# Patient Record
Sex: Female | Born: 1935 | Race: White | Hispanic: No | Marital: Married | Smoking: Never smoker
Health system: Southern US, Community
[De-identification: ages and names within clinical notes are randomized; demographics above are authoritative.]

## PROBLEM LIST (undated history)

## (undated) ENCOUNTER — Emergency Department (HOSPITAL_COMMUNITY): Discharge status: Against Medical Advice | Payer: Medicare Other

## (undated) ENCOUNTER — Emergency Department (HOSPITAL_BASED_OUTPATIENT_CLINIC_OR_DEPARTMENT_OTHER): Payer: Medicare Other

## (undated) DIAGNOSIS — K219 Gastro-esophageal reflux disease without esophagitis: Secondary | ICD-10-CM

## (undated) DIAGNOSIS — R002 Palpitations: Secondary | ICD-10-CM

## (undated) DIAGNOSIS — K635 Polyp of colon: Secondary | ICD-10-CM

## (undated) DIAGNOSIS — E559 Vitamin D deficiency, unspecified: Secondary | ICD-10-CM

## (undated) DIAGNOSIS — E669 Obesity, unspecified: Secondary | ICD-10-CM

## (undated) DIAGNOSIS — R0600 Dyspnea, unspecified: Secondary | ICD-10-CM

## (undated) DIAGNOSIS — E7800 Pure hypercholesterolemia, unspecified: Secondary | ICD-10-CM

## (undated) DIAGNOSIS — I10 Essential (primary) hypertension: Secondary | ICD-10-CM

## (undated) DIAGNOSIS — K5790 Diverticulosis of intestine, part unspecified, without perforation or abscess without bleeding: Secondary | ICD-10-CM

## (undated) HISTORY — DX: Palpitations: R00.2

## (undated) HISTORY — DX: Polyp of colon: K63.5

## (undated) HISTORY — DX: Vitamin D deficiency, unspecified: E55.9

## (undated) HISTORY — DX: Pure hypercholesterolemia, unspecified: E78.00

## (undated) HISTORY — DX: Gastro-esophageal reflux disease without esophagitis: K21.9

## (undated) HISTORY — DX: Diverticulosis of intestine, part unspecified, without perforation or abscess without bleeding: K57.90

## (undated) HISTORY — DX: Dyspnea, unspecified: R06.00

## (undated) HISTORY — DX: Essential (primary) hypertension: I10

## (undated) HISTORY — DX: Obesity, unspecified: E66.9

## (undated) HISTORY — PX: COLONOSCOPY: SHX174

---

## 1999-02-05 ENCOUNTER — Ambulatory Visit (HOSPITAL_COMMUNITY): Admission: RE | Admit: 1999-02-05 | Discharge: 1999-02-05 | Payer: Self-pay | Admitting: Gastroenterology

## 1999-06-21 ENCOUNTER — Encounter: Payer: Self-pay | Admitting: Family Medicine

## 1999-06-21 ENCOUNTER — Encounter: Admission: RE | Admit: 1999-06-21 | Discharge: 1999-06-21 | Payer: Self-pay | Admitting: Family Medicine

## 1999-09-30 ENCOUNTER — Encounter (INDEPENDENT_AMBULATORY_CARE_PROVIDER_SITE_OTHER): Payer: Self-pay

## 1999-09-30 ENCOUNTER — Other Ambulatory Visit: Admission: RE | Admit: 1999-09-30 | Discharge: 1999-09-30 | Payer: Self-pay | Admitting: Obstetrics and Gynecology

## 2000-06-26 ENCOUNTER — Encounter: Admission: RE | Admit: 2000-06-26 | Discharge: 2000-06-26 | Payer: Self-pay | Admitting: Family Medicine

## 2000-06-26 ENCOUNTER — Encounter: Payer: Self-pay | Admitting: Family Medicine

## 2001-06-28 ENCOUNTER — Encounter: Payer: Self-pay | Admitting: Family Medicine

## 2001-06-28 ENCOUNTER — Encounter: Admission: RE | Admit: 2001-06-28 | Discharge: 2001-06-28 | Payer: Self-pay | Admitting: Family Medicine

## 2002-07-01 ENCOUNTER — Encounter: Payer: Self-pay | Admitting: Family Medicine

## 2002-07-01 ENCOUNTER — Encounter: Admission: RE | Admit: 2002-07-01 | Discharge: 2002-07-01 | Payer: Self-pay | Admitting: Family Medicine

## 2003-05-07 ENCOUNTER — Ambulatory Visit (HOSPITAL_COMMUNITY): Admission: RE | Admit: 2003-05-07 | Discharge: 2003-05-07 | Payer: Self-pay | Admitting: Gastroenterology

## 2003-07-28 ENCOUNTER — Encounter: Admission: RE | Admit: 2003-07-28 | Discharge: 2003-07-28 | Payer: Self-pay | Admitting: Family Medicine

## 2004-08-05 ENCOUNTER — Encounter: Admission: RE | Admit: 2004-08-05 | Discharge: 2004-08-05 | Payer: Self-pay | Admitting: Family Medicine

## 2005-08-16 ENCOUNTER — Encounter: Admission: RE | Admit: 2005-08-16 | Discharge: 2005-08-16 | Payer: Self-pay | Admitting: Obstetrics and Gynecology

## 2006-02-15 ENCOUNTER — Ambulatory Visit: Payer: Self-pay | Admitting: Family Medicine

## 2006-03-18 HISTORY — PX: US ECHOCARDIOGRAPHY: HXRAD669

## 2006-03-22 ENCOUNTER — Ambulatory Visit: Payer: Self-pay | Admitting: Family Medicine

## 2006-03-29 ENCOUNTER — Ambulatory Visit (HOSPITAL_COMMUNITY): Admission: RE | Admit: 2006-03-29 | Discharge: 2006-03-29 | Payer: Self-pay | Admitting: Family Medicine

## 2006-03-29 ENCOUNTER — Encounter: Payer: Self-pay | Admitting: Vascular Surgery

## 2006-03-30 HISTORY — PX: CARDIOVASCULAR STRESS TEST: SHX262

## 2006-08-22 ENCOUNTER — Encounter: Admission: RE | Admit: 2006-08-22 | Discharge: 2006-08-22 | Payer: Self-pay | Admitting: Family Medicine

## 2007-05-23 ENCOUNTER — Ambulatory Visit: Payer: Self-pay | Admitting: Family Medicine

## 2007-09-11 ENCOUNTER — Encounter: Admission: RE | Admit: 2007-09-11 | Discharge: 2007-09-11 | Payer: Self-pay | Admitting: Family Medicine

## 2008-07-15 ENCOUNTER — Ambulatory Visit: Payer: Self-pay | Admitting: Family Medicine

## 2008-09-15 ENCOUNTER — Encounter: Admission: RE | Admit: 2008-09-15 | Discharge: 2008-09-15 | Payer: Self-pay | Admitting: Family Medicine

## 2008-11-25 ENCOUNTER — Ambulatory Visit: Payer: Self-pay | Admitting: Family Medicine

## 2009-07-14 ENCOUNTER — Ambulatory Visit: Payer: Self-pay | Admitting: Family Medicine

## 2009-07-18 LAB — HM DEXA SCAN: HM Dexa Scan: NORMAL

## 2009-08-12 ENCOUNTER — Ambulatory Visit: Payer: Self-pay | Admitting: Family Medicine

## 2009-09-29 ENCOUNTER — Encounter: Admission: RE | Admit: 2009-09-29 | Discharge: 2009-09-29 | Payer: Self-pay | Admitting: Family Medicine

## 2009-09-29 LAB — HM MAMMOGRAPHY: HM Mammogram: NEGATIVE

## 2010-05-18 ENCOUNTER — Ambulatory Visit: Payer: Self-pay | Admitting: Cardiology

## 2010-09-06 ENCOUNTER — Emergency Department (INDEPENDENT_AMBULATORY_CARE_PROVIDER_SITE_OTHER): Payer: MEDICARE

## 2010-09-06 ENCOUNTER — Emergency Department (HOSPITAL_BASED_OUTPATIENT_CLINIC_OR_DEPARTMENT_OTHER)
Admission: EM | Admit: 2010-09-06 | Discharge: 2010-09-06 | Disposition: A | Discharge status: Home / Self Care | Payer: MEDICARE | Attending: Emergency Medicine | Admitting: Emergency Medicine

## 2010-09-06 DIAGNOSIS — G9389 Other specified disorders of brain: Secondary | ICD-10-CM | POA: Insufficient documentation

## 2010-09-06 DIAGNOSIS — S0003XA Contusion of scalp, initial encounter: Secondary | ICD-10-CM

## 2010-09-06 DIAGNOSIS — L03119 Cellulitis of unspecified part of limb: Secondary | ICD-10-CM | POA: Insufficient documentation

## 2010-09-06 DIAGNOSIS — S8000XA Contusion of unspecified knee, initial encounter: Secondary | ICD-10-CM

## 2010-09-06 DIAGNOSIS — W101XXA Fall (on)(from) sidewalk curb, initial encounter: Secondary | ICD-10-CM

## 2010-09-06 DIAGNOSIS — IMO0002 Reserved for concepts with insufficient information to code with codable children: Secondary | ICD-10-CM | POA: Insufficient documentation

## 2010-09-06 DIAGNOSIS — M47812 Spondylosis without myelopathy or radiculopathy, cervical region: Secondary | ICD-10-CM | POA: Insufficient documentation

## 2010-09-06 DIAGNOSIS — L02419 Cutaneous abscess of limb, unspecified: Secondary | ICD-10-CM | POA: Insufficient documentation

## 2010-09-06 DIAGNOSIS — M899 Disorder of bone, unspecified: Secondary | ICD-10-CM

## 2010-09-06 DIAGNOSIS — G319 Degenerative disease of nervous system, unspecified: Secondary | ICD-10-CM | POA: Insufficient documentation

## 2010-09-06 DIAGNOSIS — I10 Essential (primary) hypertension: Secondary | ICD-10-CM | POA: Insufficient documentation

## 2010-09-15 ENCOUNTER — Encounter (INDEPENDENT_AMBULATORY_CARE_PROVIDER_SITE_OTHER): Payer: MEDICARE | Admitting: Family Medicine

## 2010-09-15 DIAGNOSIS — M949 Disorder of cartilage, unspecified: Secondary | ICD-10-CM

## 2010-09-15 DIAGNOSIS — I10 Essential (primary) hypertension: Secondary | ICD-10-CM

## 2010-09-15 DIAGNOSIS — Z0000 Encounter for general adult medical examination without abnormal findings: Secondary | ICD-10-CM

## 2010-09-15 DIAGNOSIS — E785 Hyperlipidemia, unspecified: Secondary | ICD-10-CM

## 2010-09-15 DIAGNOSIS — M899 Disorder of bone, unspecified: Secondary | ICD-10-CM

## 2010-09-30 LAB — HM MAMMOGRAPHY: HM Mammogram: NEGATIVE

## 2010-10-07 ENCOUNTER — Other Ambulatory Visit: Payer: Self-pay | Admitting: Family Medicine

## 2010-10-07 DIAGNOSIS — Z1231 Encounter for screening mammogram for malignant neoplasm of breast: Secondary | ICD-10-CM

## 2010-10-19 ENCOUNTER — Ambulatory Visit
Admission: RE | Admit: 2010-10-19 | Discharge: 2010-10-19 | Disposition: A | Discharge status: Home / Self Care | Payer: MEDICARE | Source: Ambulatory Visit | Attending: Family Medicine | Admitting: Family Medicine

## 2010-10-19 DIAGNOSIS — Z1231 Encounter for screening mammogram for malignant neoplasm of breast: Secondary | ICD-10-CM

## 2010-12-03 NOTE — Op Note (Signed)
   NAME:  Anna Acevedo, BOHANNON                      ACCOUNT NO.:  [id number]   MEDICAL RECORD NO.:  [id number]                   PATIENT TYPE:  AMB   LOCATION:  ENDO                                 FACILITY:  [facility]   PHYSICIAN:  John C. Madilyn Fireman, M.D.                 DATE OF BIRTH:  07-26-1934   DATE OF PROCEDURE:  05/07/2003  DATE OF DISCHARGE:                                 OPERATIVE REPORT   PROCEDURE:  Colonoscopy.   INDICATION FOR PROCEDURE:  History of adenomatous colon polyps, due for  surveillance.   DESCRIPTION OF PROCEDURE:  The patient was placed in the left lateral  decubitus position and placed on the pulse monitor with continuous low-flow  oxygen delivered by nasal cannula.  She was sedated with 50 mcg of IV  fentanyl and 6 mg of IV Versed.  The Olympus video colonoscope was inserted  into the rectum and advanced to the cecum, although I was unable to get the  scope maneuvered around the ileocecal valve and thus about half to a fourth  of the cecum was hidden from view.  The prep was excellent.  The visualized  portion of the cecum, ascending, transverse, and descending colon all  appeared normal with no masses, polyps, diverticula, or other mucosal  abnormalities.  Within the sigmoid colon there was seen some scattered  diverticula and no other abnormalities.  The rectum appeared normal.  Retroflexed view of the anus revealed no obvious internal hemorrhoids.  The  scope was then withdrawn and the patient returned to the recovery room in  stable condition.  She tolerated the procedure well.  There were no  immediate complications.   IMPRESSION:  Diverticulosis of the left colon, otherwise normal colonoscopy  with limited view of the cecum.   PLAN:  Repeat colonoscopy in five years.                                               John C. Madilyn Fireman, M.D.    [facility]/MEDQ  D:  05/07/2003  T:  05/07/2003  Job:  [id number]   cc:   Sharlot Gowda, M.D.  [address] [place], [place] [address]  Fax: [phone]

## 2011-02-17 ENCOUNTER — Encounter: Payer: Self-pay | Admitting: Cardiology

## 2011-02-17 ENCOUNTER — Ambulatory Visit (INDEPENDENT_AMBULATORY_CARE_PROVIDER_SITE_OTHER): Payer: Medicare Other | Admitting: Cardiology

## 2011-02-17 DIAGNOSIS — E7800 Pure hypercholesterolemia, unspecified: Secondary | ICD-10-CM

## 2011-02-17 DIAGNOSIS — E785 Hyperlipidemia, unspecified: Secondary | ICD-10-CM | POA: Insufficient documentation

## 2011-02-17 DIAGNOSIS — R002 Palpitations: Secondary | ICD-10-CM

## 2011-02-17 DIAGNOSIS — I119 Hypertensive heart disease without heart failure: Secondary | ICD-10-CM

## 2011-02-17 DIAGNOSIS — M5430 Sciatica, unspecified side: Secondary | ICD-10-CM

## 2011-02-17 DIAGNOSIS — M5431 Sciatica, right side: Secondary | ICD-10-CM

## 2011-02-17 NOTE — Assessment & Plan Note (Signed)
Patient has developed sciatica of her right thigh since last visit.  Fortunately the discomfort is controlled with low dose Tylenol or ibuprofen or Aleve.

## 2011-02-17 NOTE — Assessment & Plan Note (Signed)
The patient has a history of essential hypertension.She is on metoprolol andLisinopril hydrochlorothiazide.  She's not having any headaches or dizziness.  His not having any side effects from the medication such as cough or rash.  She has not been expressing any chest pain or angina.

## 2011-02-17 NOTE — Assessment & Plan Note (Signed)
The patient has a history of hypercholesterolemia.  She is on pravastatin and her lipids are being monitored by Dr. Susann Givens.She is not having any myalgias from the pravastatin

## 2011-02-17 NOTE — Assessment & Plan Note (Signed)
The patient has a past history of palpitations.  These have improved with current therapy of metoprolol 50 mg twice a day.  She has not been aware of any recent palpitations.

## 2011-02-17 NOTE — Progress Notes (Signed)
Duane Lope Date of Birth:  1934-12-20 [facility] [address]   [address] [place], [place]  [address] [phone]           Fax   [phone]  HPI: This pleasant 76 year old woman is seen for a scheduled six-month followup office visit.  She has a past history of essential hypertension hypercholesterolemia and palpitations.  Since last visit she has been doing well.  She has not been experiencing any chest pain or shortness of breath or recent palpitations.  He does not have any history of ischemic heart disease.  She had a normal nuclear stress test in 2007.  She also had an echocardiogram on 03/27/06 showing normal left ventricular systolic function with an ejection fraction of 55-60% and impaired relaxation mild aortic sclerosis and mild mitral regurgitation but no mitral valve prolapse and normal pulmonary artery pressure.  At the time of that study she had frequent PVCs.  These have subsequently resolved with beta blocker therapy.  Current Outpatient Prescriptions  Medication Sig Dispense Refill  . aspirin 81 MG tablet Take 81 mg by mouth daily.        Marland Kitchen CALCIUM PO Take by mouth. 600 plus d      . lisinopril-hydrochlorothiazide (PRINZIDE,ZESTORETIC) 10-12.5 MG per tablet Take 1 tablet by mouth daily. Per Dr. Susann Givens      . metoprolol (LOPRESSOR) 50 MG tablet Take 50 mg by mouth 2 (two) times daily.        . Multiple Vitamin (MULTIVITAMIN PO) Take by mouth.        Marland Kitchen NAPROXEN PO Take by mouth as needed.        . pravastatin (PRAVACHOL) 40 MG tablet Take 40 mg by mouth daily.          Allergies  Allergen Reactions  . Amoxicillin   . Keflex   . Lisinopril     cough  . Zocor (Simvastatin)     Muscle Pain    Patient Active Problem List  Diagnoses  . Benign hypertensive heart disease without heart failure  . Hypercholesterolemia  . Sciatica of right side  . Palpitations    History  Smoking status  . Never Smoker   Smokeless tobacco  .  Not on file    History  Alcohol Use No    No family history on file.  Review of Systems: The patient denies any heat or cold intolerance.  No weight gain or weight loss.  The patient denies headaches or blurry vision.  There is no cough or sputum production.  The patient denies dizziness.  There is no hematuria or hematochezia.  The patient denies any muscle aches or arthritis.  The patient denies any rash.  The patient denies frequent falling or instability.  There is no history of depression or anxiety.  All other systems were reviewed and are negative.   Physical Exam: Filed Vitals:   02/17/11 1356  BP: 136/78  Pulse: 76   The general appearance reveals a well-developed well-nourished woman in no distress.The head and neck exam reveals pupils equal and reactive.  Extraocular movements are full.  There is no scleral icterus.  The mouth and pharynx are normal.  The neck is supple.  The carotids reveal no bruits.  The jugular venous pressure is normal.  The  thyroid is not enlarged.  There is no lymphadenopathy.  The chest is clear to percussion and auscultation.  There are no rales or rhonchi.  Expansion of the  chest is symmetrical.  The precordium is quiet.  The first heart sound is normal.  The second heart sound is physiologically split.  There is no murmur gallop rub or click.  There is no abnormal lift or heave.  The abdomen is soft and nontender.  The bowel sounds are normal.  The liver and spleen are not enlarged.  There are no abdominal masses.  There are no abdominal bruits.  Extremities reveal good pedal pulses.  There is no phlebitis or edema.  There is no cyanosis or clubbing.  Strength is normal and symmetrical in all extremities.  There is no lateralizing weakness.  There are no sensory deficits.  The skin is warm and dry.  There is no rash.     Assessment / Plan:  The patient is to continue same medication.  Try to lose weight.  Continue conservative therapy for the  sciatica.  Continue cholesterol monitoring with Dr.LaLonde's office. Recheck here in 6 months for office visit and EKG.

## 2011-02-24 ENCOUNTER — Encounter: Payer: Self-pay | Admitting: Family Medicine

## 2011-05-05 ENCOUNTER — Other Ambulatory Visit: Payer: Self-pay | Admitting: *Deleted

## 2011-05-05 MED ORDER — METOPROLOL TARTRATE 50 MG PO TABS: 50.0000 mg | ORAL_TABLET | Freq: Two times a day (BID) | ORAL | Status: DC

## 2011-05-16 ENCOUNTER — Other Ambulatory Visit: Payer: Self-pay | Admitting: Family Medicine

## 2011-05-16 NOTE — Telephone Encounter (Signed)
Phoned in order for Pravachol to [facility] with 3 refills

## 2011-06-22 ENCOUNTER — Encounter: Payer: Self-pay | Admitting: Medical

## 2011-06-22 ENCOUNTER — Ambulatory Visit (INDEPENDENT_AMBULATORY_CARE_PROVIDER_SITE_OTHER): Payer: Medicare Other | Admitting: Medical

## 2011-06-22 VITALS — BP 100/60 | HR 62 | Temp 100.4°F | Resp 16 | Wt 164.0 lb

## 2011-06-22 DIAGNOSIS — J40 Bronchitis, not specified as acute or chronic: Secondary | ICD-10-CM

## 2011-06-22 MED ORDER — AZITHROMYCIN 250 MG PO TABS: ORAL_TABLET | ORAL | Status: AC

## 2011-06-22 MED ORDER — HYDROCODONE-HOMATROPINE 5-1.5 MG/5ML PO SYRP: ORAL_SOLUTION | ORAL | Status: DC

## 2011-06-22 NOTE — Patient Instructions (Signed)
Acute Bronchitis Bronchitis is a problem of the air tubes leading to your lungs. Acute means the illness started quickly. In this condition, the lining of those tubes becomes puffy (swollen) and can leak fluid. This makes it harder for air to get in and out of your lungs. You may cough a lot. This is because the air tubes are narrow. Bronchitis is most often caused by a virus. Medicines that kill germs (antibiotics) may be needed with germ (bacteria) infections for people who:  Smoke.   Have lasting (chronic) lung problems.   Are elderly.  HOME CARE  Rest.   Drink enough water and fluids to keep the pee clear or pale yellow.   Only take medicine as told by your doctor.   Medicines may be prescribed that will open up the airways. This will help make breathing easier.   Bronchitis usually gets better on its own in a few days.  Recovery from some problems (symptoms) of bronchitis may be slow. You should start feeling a little better after 2 to 3 days. Coughing may last for 3 to 4 weeks. GET HELP RIGHT AWAY IF:  You or your child has a temperature by mouth above 101, not controlled by medicine.   Chills or chest pain develops.   You or your child develops very bad shortness of breath.   There is bloody saliva mixed with mucus (sputum).   You or your child throws up (vomits) often, loses too much fluid (dehydration), feels faint, or has a very bad headache.   You or your child does not improve after 1 week of treatment.  MAKE SURE YOU:   Understand these instructions.   Will watch this condition.   Will get help right away if you or your child is not doing well or gets worse.  Document Released: 12/21/2007 Document Re-Released: 09/28/2009 [employer] Patient Information 2011 [employer].  

## 2011-06-22 NOTE — Progress Notes (Signed)
Subjective:   HPI  Anna Acevedo is a 76 y.o. female who presents with 3 day hx/o cough, chest congestion, chest hurts from coughing so much, headache, not feeling well, body aches.  Using some Coricidin HBP but this is drying her out.   Denies wheezing, SOB, no nausea, vomiting, diarrhea, no sore throat, ear pressure, no sick contacts.  She did have flu shot this season.  She does note subjective fever.  No other aggravating or relieving factors.    No other c/o.  The following portions of the patient's history were reviewed and updated as appropriate: allergies, current medications, past family history, past medical history, past social history, past surgical history and problem list.  Past Medical History  Diagnosis Date  . Hypertension     Essential  . Hypercholesterolemia   . Palpitations   . Dyspnea   . Colon polyp   . Obesity   . Osteoporosis     OSTEOPENIA  . Diverticulosis   . GERD (gastroesophageal reflux disease)    Review of Systems Constitutional: +fever, -chills, -sweats, -unexpected -weight change,-fatigue ENT: +runny nose, -ear pain, -sore throat Cardiology:  -chest pain, -palpitations, -edema Respiratory: +cough, -shortness of breath, -wheezing Gastroenterology: -abdominal pain, -nausea, -vomiting, -diarrhea, -constipation Hematology: -bleeding or bruising problems Musculoskeletal: -arthralgias, -myalgias, -joint swelling, -back pain Ophthalmology: -vision changes Urology: -dysuria, -difficulty urinating, -hematuria, -urinary frequency, -urgency Neurology: -headache, -weakness, -tingling, -numbness  Objective:   Filed Vitals:   06/22/11 1119  BP: 100/60  Pulse: 62  Temp: 100.4 F (38 C)  Resp: 16    General appearance: Alert, WD/WN, no distress, ill appearing                             Skin: warm, no rash, no diaphoresis                           Head: no sinus tenderness                            Eyes: conjunctiva normal, corneas clear,  PERRLA                            Ears: pearly TMs, external ear canals normal                          Nose: septum midline, turbinates swollen, with erythema and clear discharge             Mouth/throat: MMM, tongue normal, mild pharyngeal erythema                           Neck: supple, no adenopathy, no thyromegaly, nontender                          Heart: RRR, normal S1, S2, no murmurs                         Lungs: +bronchial breath sounds, no rhonchi, no wheezes, no rales                Extremities: no edema, nontender     Assessment and Plan:   . Encounter Diagnosis  Name Primary?  . Bronchitis  Yes   Prescription given today for Zpak and Hycodan prn for cough as below.  Discussed diagnosis and treatment of bronchitis.  Suggested symptomatic OTC remedies for cough and congestion.  Nasal saline spray for nasal congestion.  Tylenol or Ibuprofen OTC for fever and malaise.  Call/return in 2-3 days if symptoms are worse or not improving.  Advised that cough may linger even after the infection is improved.

## 2011-08-12 ENCOUNTER — Ambulatory Visit (INDEPENDENT_AMBULATORY_CARE_PROVIDER_SITE_OTHER): Payer: Medicare Other | Admitting: Family Medicine

## 2011-08-12 ENCOUNTER — Encounter: Payer: Self-pay | Admitting: Family Medicine

## 2011-08-12 VITALS — BP 150/80 | HR 68 | Ht 63.0 in | Wt 164.0 lb

## 2011-08-12 DIAGNOSIS — M25569 Pain in unspecified knee: Secondary | ICD-10-CM

## 2011-08-12 DIAGNOSIS — M25562 Pain in left knee: Secondary | ICD-10-CM

## 2011-08-12 NOTE — Progress Notes (Signed)
Chief complaint:  L knee pain.  Left knee pain x 1 month, has worsened in the past 1-2 weeks  HPI:  Gradual onset of left knee pain over the last month.  Denies any fall, trauma or change in activity.  She has taken some Tylenol and Aleve, both of which seemed to help some.  Notices some swelling at times.  Sometimes the pain is medially, and other times the pain is in other locations; pain seems "to move around".  Hurts getting up from chair.  Sometimes has pain with walking, other times doesn't.  Denies any locking or giving way   Past Medical History  Diagnosis Date  . Hypertension     Essential  . Hypercholesterolemia   . Palpitations   . Dyspnea   . Colon polyp   . Obesity   . Osteoporosis     OSTEOPENIA  . Diverticulosis   . GERD (gastroesophageal reflux disease)     Past Surgical History  Procedure Date  . US echocardiography September 2007    EF 55-60%. Showing  impaired diastolic relaxation and mild mitral regurgitation  . Cardiovascular stress test 03-30-2006    EF 72%. Showed no evidence of ischemia   . Colonoscopy     gets every 5 years    History   Social History  . Marital Status: Married    Spouse Name: N/A    Number of Children: N/A  . Years of Education: N/A   Occupational History  . Not on file.   Social History Main Topics  . Smoking status: Never Smoker   . Smokeless tobacco: Never Used  . Alcohol Use: No  . Drug Use: No  . Sexually Active: Not on file   Other Topics Concern  . Not on file   Social History Narrative  . No narrative on file   Current outpatient prescriptions:aspirin 81 MG tablet, Take 81 mg by mouth daily.  , Disp: , Rfl: ;  CALCIUM PO, Take by mouth. 600 plus d, Disp: , Rfl: ;  lisinopril-hydrochlorothiazide (PRINZIDE,ZESTORETIC) 10-12.5 MG per tablet, Take 1 tablet by mouth daily. Per Dr. Susann Givens, Disp: , Rfl: ;  metoprolol (LOPRESSOR) 50 MG tablet, Take 1 tablet (50 mg total) by mouth 2 (two) times daily., Disp: 180 tablet,  Rfl: 3 Multiple Vitamin (MULTIVITAMIN PO), Take by mouth.  , Disp: , Rfl: ;  NAPROXEN PO, Take by mouth as needed.  , Disp: , Rfl: ;  pravastatin (PRAVACHOL) 40 MG tablet, TAKE 1 TABLET BY MOUTH EVERY DAY, Disp: 30 tablet, Rfl: 3  Allergies  Allergen Reactions  . Lisinopril     cough  . Penicillins Hives and Swelling  . Zocor (Simvastatin)     Muscle Pain  . Amoxicillin Rash  . Keflex Rash   ROS:  Denies fevers, URI symptoms.  Slight cough from lisinopril, but tolerable.  Denies chest pain, shortness of breath.  Denies any stomach problems or pain currently.  Only took 1 Aleve, and tolerated.  Denies easy bleeding/bruising  PHYSICAL EXAM: BP 150/80  Pulse 68  Ht 5\' 3"  (1.6 m)  Wt 164 lb (74.39 kg)  BMI 29.05 kg/m2 Well developed, pleasant elderly female in no distress Knees: Mild soft tissue swelling asymmetry at L superomedial knee. No warmth.  FROM with slight crepitus bilaterally.  Tender at L medial knee. No pain with varus/valgus stress. Negative Lachman and McMurray tests Strength 5/5, normal sensation Skin: no rashes  Chart reviewed--normal chem panel 08/2010 (due again soon)  ASSESSMENT/PLAN:  1. Knee pain, left    most likely OA   OA L knee, without evidence on exam of internal derangement. Short trial of NSAIDs and/or tylenol arthritis Start supplements (ie glucosamine/chondroitin)  F/u if symptoms persist or worsen, for further evaluation (ie x-ray, cortisone shot, etc.)

## 2011-08-12 NOTE — Patient Instructions (Signed)
You can take Aleve twice daily with food for a week or two, or until your pain is significantly improved.  If it isn't helping with your pain, you may increase to 2 pills twice daily, with food, but only for a week, and stop it if you're having any stomach pain.  You may also use Tylenol along with the Aleve if you aren't getting complete pain relief with Aleve.  In the longterm, Tylenol arthritis is the better plan for chronic knee pain from arthritis.  I'm recommending Aleve in the short term, especially because there is some swelling today.   I also recommend trial of supplements containing glucosamine and chondroitin (ie osteobiflex).  If you are having ongoing pain and/or swelling, please return for re-evaluation.    Degenerative Arthritis You have osteoarthritis. This is the wear and tear arthritis that comes with aging. It is also called degenerative arthritis. This is common in people past middle age. It is caused by stress on the joints. The large weight bearing joints of the lower extremities are most often affected. The knees, hips, back, neck, and hands can become painful, swollen, and stiff. This is the most common type of arthritis. It comes on with age, carrying too much weight, or from an injury. Treatment includes resting the sore joint until the pain and swelling improve. Crutches or a walker may be needed for severe flares. Only take over-the-counter or prescription medicines for pain, discomfort, or fever as directed by your caregiver. Local heat therapy may improve motion. Cortisone shots into the joint are sometimes used to reduce pain and swelling during flares. Osteoarthritis is usually not crippling and progresses slowly. There are things you can do to decrease pain:  Avoid high impact activities.   Exercise regularly.   Low impact exercises such as walking, biking and swimming help to keep the muscles strong and keep normal joint function.   Stretching helps to keep your  range of motion.   Lose weight if you are overweight. This reduces joint stress.  In severe cases when you have pain at rest or increasing disability, joint surgery may be helpful. See your caregiver for follow-up treatment as recommended.  SEEK IMMEDIATE MEDICAL CARE IF:   You have severe joint pain.   Marked swelling and redness in your joint develops.   You develop a high fever.  Document Released: 07/04/2005 Document Revised: 03/16/2011 Document Reviewed: 12/04/2006 [facility] Patient Information 2012 [place], [place].

## 2011-08-16 ENCOUNTER — Ambulatory Visit (INDEPENDENT_AMBULATORY_CARE_PROVIDER_SITE_OTHER): Payer: Medicare Other | Admitting: Cardiology

## 2011-08-16 ENCOUNTER — Encounter: Payer: Self-pay | Admitting: Cardiology

## 2011-08-16 VITALS — BP 156/80 | HR 58 | Ht 64.0 in | Wt 168.0 lb

## 2011-08-16 DIAGNOSIS — E7800 Pure hypercholesterolemia, unspecified: Secondary | ICD-10-CM

## 2011-08-16 DIAGNOSIS — I119 Hypertensive heart disease without heart failure: Secondary | ICD-10-CM

## 2011-08-16 DIAGNOSIS — M1990 Unspecified osteoarthritis, unspecified site: Secondary | ICD-10-CM | POA: Insufficient documentation

## 2011-08-16 NOTE — Patient Instructions (Signed)
Your physician recommends that you continue on your current medications as directed. Please refer to the Current Medication list given to you today. Work harder on [employer] loss Your physician wants you to follow-up in: 6 months You will receive a reminder letter in the mail two months in advance. If you don't receive a letter, please call our office to schedule the follow-up appointment.

## 2011-08-16 NOTE — Assessment & Plan Note (Addendum)
The patient has been experiencing left knee pain secondary to osteoarthritic deformity.  She has been taking nonsteroidal anti-inflammatories.  Her blood pressure has been running a little high which may be secondary to the nonsteroidal anti-inflammatories.  She will try switching over to Tylenol which will have less of an effect on her blood pressure.

## 2011-08-16 NOTE — Assessment & Plan Note (Signed)
She has not been having any symptoms of congestive heart failure.  No headaches dizziness or syncope.  No chest pain or shortness of breath.  Her blood pressure has been running high and she will try to avoid the nonsteroidal anti-inflammatories as much as possible.

## 2011-08-16 NOTE — Assessment & Plan Note (Signed)
Patient is on pravastatin for her cholesterol.  She is not having any myalgias symptoms.  Her knee pain is limited to the knee.

## 2011-08-16 NOTE — Progress Notes (Signed)
Anna Acevedo Date of Birth:  05/24/1935 [facility] [address] [place], [place]  [address] [phone]  Fax   [phone]  HPI: This pleasant 77 year old woman is seen for a scheduled 6 month followup office visit.  She has a history of essential hypertension, hypercholesterolemia, and palpitations.  She does not have any history of ischemic heart disease and she had a normal nuclear stress test in 2007.  An echocardiogram in 2007 showing normal systolic function with an ejection fraction of 55-60% and impaired relaxation.  She also had mild aortic sclerosis and mild mitral regurgitation.  She did have frequent PVCs at the time of that study and she was subsequently placed on beta blocker with resolution of the PVCs.  Since last visit she has been feeling well.  Denies any chest pain or shortness of breath.  Current Outpatient Prescriptions  Medication Sig Dispense Refill  . aspirin 81 MG tablet Take 81 mg by mouth daily.        Marland Kitchen CALCIUM PO Take by mouth. 600 plus d      . lisinopril-hydrochlorothiazide (PRINZIDE,ZESTORETIC) 10-12.5 MG per tablet Take 1 tablet by mouth daily. Per Dr. Susann Givens      . metoprolol (LOPRESSOR) 50 MG tablet Take 1 tablet (50 mg total) by mouth 2 (two) times daily.  180 tablet  3  . Multiple Vitamin (MULTIVITAMIN PO) Take by mouth.        Marland Kitchen NAPROXEN PO Take by mouth as needed.        . pravastatin (PRAVACHOL) 40 MG tablet TAKE 1 TABLET BY MOUTH EVERY DAY  30 tablet  3    Allergies  Allergen Reactions  . Lisinopril     cough  . Penicillins Hives and Swelling  . Zocor (Simvastatin)     Muscle Pain  . Amoxicillin Rash  . Keflex Rash    Patient Active Problem List  Diagnoses  . Benign hypertensive heart disease without heart failure  . Hypercholesterolemia  . Sciatica of right side  . Palpitations  . Bronchitis    History  Smoking status  . Never Smoker   Smokeless tobacco  . Never Used    History  Alcohol Use  No    No family history on file.  Review of Systems: The patient denies any heat or cold intolerance.  No weight gain or weight loss.  The patient denies headaches or blurry vision.  There is no cough or sputum production.  The patient denies dizziness.  There is no hematuria or hematochezia.  The patient denies any muscle aches or arthritis.  The patient denies any rash.  The patient denies frequent falling or instability.  There is no history of depression or anxiety.  All other systems were reviewed and are negative.   Physical Exam: Filed Vitals:   08/16/11 0935  BP: 156/80  Pulse: 58   the general appearance reveals a well-developed well-nourished woman in no distress.The head and neck exam reveals pupils equal and reactive.  Extraocular movements are full.  There is no scleral icterus.  The mouth and pharynx are normal.  The neck is supple.  The carotids reveal no bruits.  The jugular venous pressure is normal.  The  thyroid is not enlarged.  There is no lymphadenopathy.  The chest is clear to percussion and auscultation.  There are no rales or rhonchi.  Expansion of the chest is symmetrical.  The precordium is quiet.  The first heart sound is normal.  The  second heart sound is physiologically split.  There is no murmur gallop rub or click.  There is no abnormal lift or heave.  The abdomen is soft and nontender.  The bowel sounds are normal.  The liver and spleen are not enlarged.  There are no abdominal masses.  There are no abdominal bruits.  Extremities reveal good pedal pulses.  There is no phlebitis or edema.  There is no cyanosis or clubbing.  Strength is normal and symmetrical in all extremities.  There is no lateralizing weakness.  There are no sensory deficits.  The skin is warm and dry.  There is no rash.  EKG shows sinus bradycardia and minor nonspecific T-wave flattening but no acute ischemic change.    Assessment / Plan: Continue same medication.  She has gained 5 pounds since  last visit which she should try to lose.  Recheck in 6 months for followup office visit.

## 2011-08-23 ENCOUNTER — Ambulatory Visit
Admission: RE | Admit: 2011-08-23 | Discharge: 2011-08-23 | Disposition: A | Discharge status: Home / Self Care | Payer: Medicare Other | Source: Ambulatory Visit | Attending: Family Medicine | Admitting: Family Medicine

## 2011-08-23 ENCOUNTER — Ambulatory Visit (INDEPENDENT_AMBULATORY_CARE_PROVIDER_SITE_OTHER): Payer: Medicare Other | Admitting: Family Medicine

## 2011-08-23 VITALS — BP 132/70 | HR 75 | Ht 60.0 in | Wt 162.0 lb

## 2011-08-23 DIAGNOSIS — M25562 Pain in left knee: Secondary | ICD-10-CM

## 2011-08-23 DIAGNOSIS — M25569 Pain in unspecified knee: Secondary | ICD-10-CM

## 2011-08-23 NOTE — Progress Notes (Signed)
  Subjective:    Patient ID: Anna Acevedo, female    DOB: 1934/08/15, 77 y.o.   MRN: [id number]  HPI She complains of a one-month history of left knee pain. It is made worse with physical activities. She has had no injuries. No previous history of damage. No popping, locking or grinding. No other joints are involved.   Review of Systems     Objective:   Physical Exam Gen. the left knee shows a small effusion. There is some crepitus on manipulation of her patella. Medial and lateral collateral ligaments as well as anterior cruciate ligament are normal. McMurray testing negative.       Assessment & Plan:   1. Left knee pain  DG Knee 1-2 Views Left

## 2011-08-25 ENCOUNTER — Encounter: Payer: Self-pay | Admitting: Family Medicine

## 2011-08-25 ENCOUNTER — Ambulatory Visit (INDEPENDENT_AMBULATORY_CARE_PROVIDER_SITE_OTHER): Payer: Medicare Other | Admitting: Family Medicine

## 2011-08-25 VITALS — BP 126/80 | HR 60 | Ht 60.0 in | Wt 162.0 lb

## 2011-08-25 DIAGNOSIS — M1712 Unilateral primary osteoarthritis, left knee: Secondary | ICD-10-CM

## 2011-08-25 NOTE — Progress Notes (Signed)
  Subjective:    Patient ID: Anna Acevedo, female    DOB: 12/04/1934, 77 y.o.   MRN: [id number]  HPI She is here for evaluation of continued difficulty with left knee pain. Recent x-ray did show evidence of medial joint degenerative changes.   Review of Systems     Objective:   Physical Exam Alert and in no distress. Some pain on motion of the knee with slight lipping noted.       Assessment & Plan:  Left knee arthritis. I discussed the options with her concerning care of this including pain medications specifically Tylenol or Advil/Aleve. She is getting some relief but not enough. Also discussed injection with steroids versus Synvisc as well as potentially needing surgery. She has elected to have a steroid injection. 40 mg of Kenalog and 3 cc of Xylocaine was injected medially into the joint line without difficulty. She experienced some relief of her symptoms. Encouraged her to return here as needed.

## 2011-09-11 ENCOUNTER — Other Ambulatory Visit: Payer: Self-pay | Admitting: Family Medicine

## 2011-09-12 NOTE — Telephone Encounter (Signed)
Is this ok?

## 2011-09-13 ENCOUNTER — Encounter: Payer: Self-pay | Admitting: Internal Medicine

## 2011-09-19 ENCOUNTER — Encounter: Payer: Self-pay | Admitting: Family Medicine

## 2011-09-19 ENCOUNTER — Ambulatory Visit (INDEPENDENT_AMBULATORY_CARE_PROVIDER_SITE_OTHER): Payer: Medicare Other | Admitting: Family Medicine

## 2011-09-19 DIAGNOSIS — I10 Essential (primary) hypertension: Secondary | ICD-10-CM

## 2011-09-19 DIAGNOSIS — E7800 Pure hypercholesterolemia, unspecified: Secondary | ICD-10-CM

## 2011-09-19 DIAGNOSIS — M1990 Unspecified osteoarthritis, unspecified site: Secondary | ICD-10-CM

## 2011-09-19 DIAGNOSIS — M949 Disorder of cartilage, unspecified: Secondary | ICD-10-CM

## 2011-09-19 DIAGNOSIS — K219 Gastro-esophageal reflux disease without esophagitis: Secondary | ICD-10-CM

## 2011-09-19 DIAGNOSIS — Z79899 Other long term (current) drug therapy: Secondary | ICD-10-CM

## 2011-09-19 DIAGNOSIS — M8580 Other specified disorders of bone density and structure, unspecified site: Secondary | ICD-10-CM | POA: Insufficient documentation

## 2011-09-19 LAB — CBC WITH DIFFERENTIAL/PLATELET
Basophils Absolute: 0 10*3/uL (ref 0.0–0.1)
Basophils Relative: 1 % (ref 0–1)
HCT: 45 % (ref 36.0–46.0)
Hemoglobin: 14.5 g/dL (ref 12.0–15.0)
Lymphocytes Relative: 20 % (ref 12–46)
MCH: 27.9 pg (ref 26.0–34.0)
MCHC: 32.2 g/dL (ref 30.0–36.0)
MCV: 86.7 fL (ref 78.0–100.0)
Monocytes Relative: 8 % (ref 3–12)
RBC: 5.19 MIL/uL — ABNORMAL HIGH (ref 3.87–5.11)
RDW: 15.4 % (ref 11.5–15.5)

## 2011-09-19 LAB — COMPREHENSIVE METABOLIC PANEL
BUN: 12 mg/dL (ref 6–23)
Chloride: 105 mEq/L (ref 96–112)
Glucose, Bld: 92 mg/dL (ref 70–99)
Potassium: 4.2 mEq/L (ref 3.5–5.3)
Sodium: 143 mEq/L (ref 135–145)
Total Protein: 7 g/dL (ref 6.0–8.3)

## 2011-09-19 LAB — LIPID PANEL
HDL: 42 mg/dL (ref >39)
Total CHOL/HDL Ratio: 5.1 Ratio
Triglycerides: 197 mg/dL — ABNORMAL HIGH (ref <150)
VLDL: 39 mg/dL (ref 0–40)

## 2011-09-19 NOTE — Progress Notes (Signed)
  Subjective:    Patient ID: Anna Acevedo, female    DOB: Nov 16, 1934, 77 y.o.   MRN: [id number]  HPI She is here for an interval evaluation. She has had some difficulty with coughing but states that it only occurs about 3 times per month. She also has some difficulty with arthritis and presently is on glucosamine. She discussed the fact she is on CoQ10. Her left knee status give her difficulty and she has had an injection on this in the past. She has a history of osteopenia and has not had a DEXA recently. She is having no reflux symptoms at the present time. She has no other concerns or complaints. Her marriage is going well.  Review of Systems  Constitutional: Negative.   HENT: Negative.   Respiratory: Negative.   Cardiovascular: Negative.   Gastrointestinal: Negative.   Genitourinary: Negative.   Neurological: Negative.        Objective:   Physical Exam BP 130/82  Pulse 68  Ht 5\' 3"  (1.6 m)  Wt 162 lb (73.483 kg)  BMI 28.70 kg/m2  General Appearance:    Alert, cooperative, no distress, appears stated age  Head:    Normocephalic, without obvious abnormality, atraumatic  Eyes:    PERRL, conjunctiva/corneas clear, EOM's intact, fundi    benign  Ears:    Normal TM's and external ear canals  Nose:   Nares normal, mucosa normal, no drainage or sinus   tenderness  Throat:   Lips, mucosa, and tongue normal; teeth and gums normal  Neck:   Supple, no lymphadenopathy;  thyroid:  no   enlargement/tenderness/nodules; no carotid   bruit or JVD  Back:    Spine nontender, no curvature, ROM normal, no CVA     tenderness  Lungs:     Clear to auscultation bilaterally without wheezes, rales or     ronchi; respirations unlabored  Chest Wall:    No tenderness or deformity   Heart:    Regular rate and rhythm, S1 and S2 normal, no murmur, rub   or gallop  Breast Exam:    Deferred to GYN  Abdomen:     Soft, non-tender, nondistended, normoactive bowel sounds,    no masses, no  hepatosplenomegaly  Genitalia:    Deferred to GYN     Extremities:   No clubbing, cyanosis or edema  Pulses:   2+ and symmetric all extremities  Skin:   Skin color, texture, turgor normal, no rashes or lesions  Lymph nodes:   Cervical, supraclavicular, and axillary nodes normal  Neurologic:   CNII-XII intact, normal strength, sensation and gait; reflexes 2+ and symmetric throughout          Psych:   Normal mood, affect, hygiene and grooming.           Assessment & Plan:   1. Hypercholesterolemia  Lipid panel  2. Osteoarthritis    3. Hypertension  CBC with Differential, Comprehensive metabolic panel  4. Osteopenia  DG Bone Density  5. Encounter for long-term (current) use of other medications  CBC with Differential, Comprehensive metabolic panel, Lipid panel  6. GERD (gastroesophageal reflux disease)     recommend she use Tylenol for her arthritis pain. She presently is not using Naprosyn. I discussed followup mammogram as well as colonoscopy with her.

## 2011-09-19 NOTE — Patient Instructions (Signed)
Continue to take good care of yourself 

## 2011-09-20 NOTE — Progress Notes (Signed)
Quick Note:  The blood work is normal ______ 

## 2011-09-27 ENCOUNTER — Other Ambulatory Visit: Payer: Self-pay | Admitting: Family Medicine

## 2011-12-21 ENCOUNTER — Other Ambulatory Visit: Payer: Self-pay | Admitting: Family Medicine

## 2012-01-03 ENCOUNTER — Other Ambulatory Visit: Payer: Self-pay | Admitting: Family Medicine

## 2012-02-07 ENCOUNTER — Other Ambulatory Visit: Payer: Self-pay | Admitting: Family Medicine

## 2012-04-27 ENCOUNTER — Other Ambulatory Visit: Payer: Self-pay | Admitting: *Deleted

## 2012-04-27 MED ORDER — METOPROLOL TARTRATE 50 MG PO TABS: 50.0000 mg | ORAL_TABLET | Freq: Two times a day (BID) | ORAL | Status: DC

## 2012-04-27 NOTE — Telephone Encounter (Signed)
Fax Received. Refill Completed. Anna Acevedo (R.M.A)   

## 2012-06-05 ENCOUNTER — Encounter: Payer: Self-pay | Admitting: Gynecology

## 2012-06-05 ENCOUNTER — Ambulatory Visit (INDEPENDENT_AMBULATORY_CARE_PROVIDER_SITE_OTHER): Payer: Medicare Other | Admitting: Gynecology

## 2012-06-05 VITALS — BP 136/84 | Ht 62.5 in | Wt 166.0 lb

## 2012-06-05 DIAGNOSIS — N952 Postmenopausal atrophic vaginitis: Secondary | ICD-10-CM

## 2012-06-05 DIAGNOSIS — N898 Other specified noninflammatory disorders of vagina: Secondary | ICD-10-CM

## 2012-06-05 DIAGNOSIS — M8580 Other specified disorders of bone density and structure, unspecified site: Secondary | ICD-10-CM

## 2012-06-05 DIAGNOSIS — M949 Disorder of cartilage, unspecified: Secondary | ICD-10-CM

## 2012-06-05 DIAGNOSIS — L293 Anogenital pruritus, unspecified: Secondary | ICD-10-CM

## 2012-06-05 LAB — WET PREP FOR TRICH, YEAST, CLUE
Clue Cells Wet Prep HPF POC: NONE SEEN
Trich, Wet Prep: NONE SEEN

## 2012-06-05 MED ORDER — TERCONAZOLE 0.4 % VA CREA: 1.0000 | TOPICAL_CREAM | Freq: Every day | VAGINAL | Status: DC

## 2012-06-05 NOTE — Patient Instructions (Signed)
Candidal Vulvovaginitis Candidal vulvovaginitis is an infection of the vagina and vulva. The vulva is the skin around the opening of the vagina. This may cause itching and discomfort in and around the vagina.  HOME CARE  Only take medicine as told by your doctor.  Do not have sex (intercourse) until the infection is healed or as told by your doctor.  Practice safe sex.  Tell your sex partner about your infection.  Do not douche or use tampons.  Wear cotton underwear. Do not wear tight pants or panty hose.  Eat yogurt. This may help treat and prevent yeast infections. GET HELP RIGHT AWAY IF:   You have a fever.  Your problems get worse during treatment or do not get better in 3 days.  You have discomfort, irritation, or itching in your vagina or vulva area.  You have pain after sex.  You start to get belly (abdominal) pain. MAKE SURE YOU:  Understand these instructions.  Will watch your condition.  Will get help right away if you are not doing well or get worse. Document Released: 09/30/2008 Document Revised: 09/26/2011 Document Reviewed: 09/30/2008 Exit[employer] Patient Information 2013 [employer].  

## 2012-06-05 NOTE — Progress Notes (Signed)
A this-year-old new patient to the practice. Patient is to be followed by Dr. Elana Alm who has retired. Her main complaint for the visit today was mobile vaginal irritation and pruritus. She denied any true discharge. She stated her prior physician at treating her for yeast infections in the past.  Patient stated in the past she had been on hormone replacement therapy for 5 years. She has not had a full gynecological examination in approximately 2 years. Patient denies any prior history of abnormal Pap smears. Patient denies any dysuria or frequency. Patient stated her last colonoscopy was 4 years ago was normal. Her mammogram was a year and a half ago. She frequently does her self breast examination. Review records indicated that her last bone density study was in 2011 and her lowest T score was at the AP spine with a value of -2.1 and the left femoral neck -1.3. Patient denies any past history of abnormal Pap smears. Patient's primary physician is Dr. Susann Givens who has been doing patient's lab work in his been treating her for hypercholesterolemia and hypertension.  Exam: Bartholin urethra Skene glands with atrophic changes external vulvar with an excoriated areas apparently from prior scratching. Speculum exam: No discharge vaginal atrophy was evident Cervix: No lesions or discharge Bimanual exam not done Rectal exam not done  Wet prep: Rare WBC few bacteria  Assessment/plan apparent vulvovaginitis probably attributed to either atrophy or possibly moniliasis. Patient will be given a prescription for Monistat 7 to apply each bedtime for one week. Requisition was provided for her to schedule a bone density study as well as a mammogram. She will then make an appointment to see me for complete gynecological examination in January.

## 2012-06-25 ENCOUNTER — Other Ambulatory Visit: Payer: Self-pay | Admitting: Family Medicine

## 2012-07-18 HISTORY — PX: EYE SURGERY: SHX253

## 2012-07-24 ENCOUNTER — Ambulatory Visit (INDEPENDENT_AMBULATORY_CARE_PROVIDER_SITE_OTHER): Payer: Medicare Other | Admitting: Gynecology

## 2012-07-24 ENCOUNTER — Encounter: Payer: Self-pay | Admitting: Gynecology

## 2012-07-24 VITALS — BP 136/88 | Ht 62.5 in | Wt 164.0 lb

## 2012-07-24 DIAGNOSIS — M899 Disorder of bone, unspecified: Secondary | ICD-10-CM

## 2012-07-24 DIAGNOSIS — M949 Disorder of cartilage, unspecified: Secondary | ICD-10-CM

## 2012-07-24 DIAGNOSIS — M8580 Other specified disorders of bone density and structure, unspecified site: Secondary | ICD-10-CM

## 2012-07-24 DIAGNOSIS — N951 Menopausal and female climacteric states: Secondary | ICD-10-CM | POA: Insufficient documentation

## 2012-07-24 DIAGNOSIS — N760 Acute vaginitis: Secondary | ICD-10-CM | POA: Insufficient documentation

## 2012-07-24 DIAGNOSIS — Z780 Asymptomatic menopausal state: Secondary | ICD-10-CM

## 2012-07-24 MED ORDER — NONFORMULARY OR COMPOUNDED ITEM: Status: DC

## 2012-07-24 NOTE — Patient Instructions (Addendum)

## 2012-07-24 NOTE — Progress Notes (Signed)
Anna Acevedo [birth year] [id number]   History:    78 y.o.  who presented to the office today complaining of vaginal irritation and pruritus. Patient denies any vaginal discharge. Review of her record indicated that she was seen in the office on June 05, 2012 and was treated for monilial vulvovaginitis with Monistat 7.  Patient was new to our practice. She was seen Dr. Elana Alm who has retired.  Patient stated in the past she had been on hormone replacement therapy for 5 years. She has not had a full gynecological examination in approximately 2 years. Patient denies any prior history of abnormal Pap smears. Patient's last colonoscopy was in 2009 and was normal. Her mammogram is overdue and was normal in 2012 and she does her monthly self breast examination. Review records indicated that her last bone density study was in 2011 and her lowest T score was at the AP spine with a value of -2.1 and the left femoral neck -1.3. Patient's primary physician is Dr. Susann Givens who has been doing patient's lab work in his been treating her for hypercholesterolemia and hypertension.   Past medical history,surgical history, family history and social history were all reviewed and documented in the EPIC chart.  Gynecologic History No LMP recorded. Patient is postmenopausal. Contraception: post menopausal status Last Pap: More than 5 years ago. Results were: normal Last mammogram: 2012. Results were: normal  Obstetric History OB History    Grav Para Term Preterm Abortions TAB SAB Ect Mult Living   2 2 2       2      # Outc Date GA Lbr Len/2nd Wgt Sex Del Anes PTL Lv   1 TRM     M SVD  No Yes   2 TRM     F SVD  No Yes       ROS: A ROS was performed and pertinent positives and negatives are included in the history.  GENERAL: No fevers or chills. HEENT: No change in vision, no earache, sore throat or sinus congestion. NECK: No pain or stiffness. CARDIOVASCULAR: No chest pain or pressure. No palpitations.  PULMONARY: No shortness of breath, cough or wheeze. GASTROINTESTINAL: No abdominal pain, nausea, vomiting or diarrhea, melena or bright red blood per rectum. GENITOURINARY: No urinary frequency, urgency, hesitancy or dysuria. MUSCULOSKELETAL: No joint or muscle pain, no back pain, no recent trauma. DERMATOLOGIC: No rash, no itching, no lesions. ENDOCRINE: No polyuria, polydipsia, no heat or cold intolerance. No recent change in weight. HEMATOLOGICAL: No anemia or easy bruising or bleeding. NEUROLOGIC: No headache, seizures, numbness, tingling or weakness. PSYCHIATRIC: No depression, no loss of interest in normal activity or change in sleep pattern.     Exam: chaperone present  BP 136/88  Ht 5' 2.5" (1.588 m)  Wt 164 lb (74.39 kg)  BMI 29.52 kg/m2  Body mass index is 29.52 kg/(m^2).  General appearance : Well developed well nourished female. No acute distress HEENT: Neck supple, trachea midline, no carotid bruits, no thyroidmegaly Lungs: Clear to auscultation, no rhonchi or wheezes, or rib retractions  Heart: Regular rate and rhythm, no murmurs or gallops Breast:Examined in sitting and supine position were symmetrical in appearance, no palpable masses or tenderness,  no skin retraction, no nipple inversion, no nipple discharge, no skin discoloration, no axillary or supraclavicular lymphadenopathy Abdomen: no palpable masses or tenderness, no rebound or guarding Extremities: no edema or skin discoloration or tenderness  Pelvic:  Bartholin, Urethra, Skene Glands: Within normal limits  Vagina: No gross lesions or discharge, atrophic changes  Cervix: No gross lesions or discharge  Uterus  axial, normal size, shape and consistency, non-tender and mobile  Adnexa  Without masses or tenderness  Anus and perineum  normal   Rectovaginal  normal sphincter tone without palpated masses or tenderness             Hemoccult cards provided     Assessment/Plan:  78 y.o. female with atrophic  vulvovaginitis. We discussed about placing her on a vaginal estrogen twice a week such as estradiol 0.02% 1 mL prefilled applicator. The risks benefits and pros and cons discussed. Women's health initiative study discussed. Patient with no past history of breast cancer and does her monthly self breast examination. Patient will be scheduled her mammogram in the next few weeks. Patient was given Hemoccult card to submit to the office for testing. No blood work drawn since her primary physician did her blood work a few months ago. She also will be scheduled for bone density study in March. No Pap smear done today the new screening guidelines were discussed. Patient with no prior history of abnormal Pap smears. We discussed importance of calcium and vitamin D and regular exercise for osteoporosis prevention.   Ok Edwards MD, 9:14 AM 07/24/2012

## 2012-07-31 ENCOUNTER — Other Ambulatory Visit: Payer: Self-pay | Admitting: Anesthesiology

## 2012-07-31 ENCOUNTER — Other Ambulatory Visit: Payer: Self-pay | Admitting: Gynecology

## 2012-07-31 DIAGNOSIS — Z1211 Encounter for screening for malignant neoplasm of colon: Secondary | ICD-10-CM

## 2012-08-06 ENCOUNTER — Other Ambulatory Visit: Payer: Self-pay

## 2012-08-06 MED ORDER — METOPROLOL TARTRATE 50 MG PO TABS: 50.0000 mg | ORAL_TABLET | Freq: Two times a day (BID) | ORAL | Status: DC

## 2012-08-21 ENCOUNTER — Ambulatory Visit (INDEPENDENT_AMBULATORY_CARE_PROVIDER_SITE_OTHER): Payer: Medicare Other | Admitting: Cardiology

## 2012-08-21 ENCOUNTER — Encounter: Payer: Self-pay | Admitting: Cardiology

## 2012-08-21 VITALS — BP 130/64 | HR 78 | Resp 18 | Ht 64.0 in | Wt 166.0 lb

## 2012-08-21 DIAGNOSIS — I119 Hypertensive heart disease without heart failure: Secondary | ICD-10-CM

## 2012-08-21 DIAGNOSIS — R002 Palpitations: Secondary | ICD-10-CM

## 2012-08-21 DIAGNOSIS — E7800 Pure hypercholesterolemia, unspecified: Secondary | ICD-10-CM

## 2012-08-21 MED ORDER — METOPROLOL TARTRATE 50 MG PO TABS: 50.0000 mg | ORAL_TABLET | Freq: Two times a day (BID) | ORAL | Status: DC

## 2012-08-21 NOTE — Assessment & Plan Note (Signed)
The patient has a history of hypercholesterolemia.  She is tolerating pravastatin 40 mg daily.  No side effects.  Her lipids are followed by her PCP

## 2012-08-21 NOTE — Assessment & Plan Note (Signed)
She has very infrequent palpitations.  She has had no sustained tachycardia arrhythmia.  No dizziness or syncope.  No side effects from her beta blocker

## 2012-08-21 NOTE — Patient Instructions (Addendum)
Your physician recommends that you continue on your current medications as directed. Please refer to the Current Medication list given to you today.  Your physician wants you to follow-up in: 1 year ov/ekg You will receive a reminder letter in the mail two months in advance. If you don't receive a letter, please call our office to schedule the follow-up appointment.  

## 2012-08-21 NOTE — Progress Notes (Signed)
Duane Lope Date of Birth:  Jan 21, 1935 [facility] [address] [place], [place]  [address] [phone]  Fax   [phone]  HPI:  This pleasant 78 year old woman is seen for a scheduled 12 month followup office visit. She has a history of essential hypertension, hypercholesterolemia, and palpitations. She does not have any history of ischemic heart disease and she had a normal nuclear stress test in 2007.  She had An echocardiogram in 2007 showing normal systolic function with an ejection fraction of 55-60% and impaired relaxation. She also had mild aortic sclerosis and mild mitral regurgitation. She did have frequent PVCs at the time of that study and she was subsequently placed on beta blocker with resolution of the PVCs. Since last visit she has been feeling well. Denies any chest pain or shortness of breath.  Current Outpatient Prescriptions  Medication Sig Dispense Refill  . acetaminophen (TYLENOL) 500 MG tablet Take 500 mg by mouth every 6 (six) hours as needed.      Marland Kitchen aspirin 81 MG tablet Take 81 mg by mouth daily.        Marland Kitchen CALCIUM PO Take by mouth. 600 plus d      . lisinopril-hydrochlorothiazide (PRINZIDE,ZESTORETIC) 10-12.5 MG per tablet TAKE 1 TABLET EVERY DAY  30 tablet  11  . metoprolol (LOPRESSOR) 50 MG tablet Take 1 tablet (50 mg total) by mouth 2 (two) times daily.  180 tablet  3  . Multiple Vitamin (MULTIVITAMIN PO) Take by mouth.        Marland Kitchen NAPROXEN PO Take by mouth as needed.        . NONFORMULARY OR COMPOUNDED ITEM Estradiol .02% 1 ML Prefilled Applicator Sig: apply vaginally twice a week #90 Day Supply with 4 refills  1 each  4  . pravastatin (PRAVACHOL) 40 MG tablet TAKE 1 TABLET BY MOUTH EVERY DAY  30 tablet  5  . terconazole (TERAZOL 7) 0.4 % vaginal cream Place 1 applicator vaginally at bedtime.  45 g  3    Allergies  Allergen Reactions  . Penicillins Hives and Swelling  . Zocor (Simvastatin)     Muscle Pain  . Amoxicillin Rash   . Cephalexin Rash    Patient Active Problem List  Diagnosis  . Benign hypertensive heart disease without heart failure  . Hypercholesterolemia  . Sciatica of right side  . Palpitations  . Bronchitis  . Osteoarthritis  . Hypertension  . Osteopenia  . Vulvovaginitis, estrogen deficient  . Post menopausal syndrome    History  Smoking status  . Never Smoker   Smokeless tobacco  . Never Used    History  Alcohol Use No    Family History  Problem Relation Age of Onset  . Hypertension Mother     Review of Systems: The patient denies any heat or cold intolerance.  No weight gain or weight loss.  The patient denies headaches or blurry vision.  There is no cough or sputum production.  The patient denies dizziness.  There is no hematuria or hematochezia.  The patient denies any muscle aches or arthritis.  The patient denies any rash.  The patient denies frequent falling or instability.  There is no history of depression or anxiety.  All other systems were reviewed and are negative.   Physical Exam: Filed Vitals:   08/21/12 1345  BP: 130/64  Pulse: 78  Resp: 18   the general appearance reveals a well-developed well-nourished elderly woman in no distress.The head and neck exam  reveals pupils equal and reactive.  Extraocular movements are full.  There is no scleral icterus.  The mouth and pharynx are normal.  The neck is supple.  The carotids reveal no bruits.  The jugular venous pressure is normal.  The  thyroid is not enlarged.  There is no lymphadenopathy.  The chest is clear to percussion and auscultation.  There are no rales or rhonchi.  Expansion of the chest is symmetrical.  The precordium is quiet.  The first heart sound is normal.  The second heart sound is physiologically split.  There is no murmur gallop rub or click.  There is no abnormal lift or heave.  The abdomen is soft and nontender.  The bowel sounds are normal.  The liver and spleen are not enlarged.  There are no  abdominal masses.  There are no abdominal bruits.  Extremities reveal good pedal pulses.  There is no phlebitis or edema.  There is no cyanosis or clubbing.  Strength is normal and symmetrical in all extremities.  There is no lateralizing weakness.  There are no sensory deficits.  The skin is warm and dry.  There is no rash.      Assessment / Plan: Continue same medication.  Recheck in one year for office visit and EKG

## 2012-08-21 NOTE — Assessment & Plan Note (Signed)
Blood pressure has been remaining stable on current therapy.  Is not having any headaches dizziness or syncope.  She has not been having any symptoms of CHF.

## 2012-08-27 ENCOUNTER — Other Ambulatory Visit: Payer: Self-pay | Admitting: Gynecology

## 2012-08-27 DIAGNOSIS — M8580 Other specified disorders of bone density and structure, unspecified site: Secondary | ICD-10-CM

## 2012-08-28 ENCOUNTER — Ambulatory Visit (INDEPENDENT_AMBULATORY_CARE_PROVIDER_SITE_OTHER): Payer: Medicare Other

## 2012-08-28 DIAGNOSIS — M8580 Other specified disorders of bone density and structure, unspecified site: Secondary | ICD-10-CM

## 2012-08-28 DIAGNOSIS — M899 Disorder of bone, unspecified: Secondary | ICD-10-CM

## 2012-09-05 ENCOUNTER — Other Ambulatory Visit: Payer: Self-pay | Admitting: *Deleted

## 2012-09-05 DIAGNOSIS — I119 Hypertensive heart disease without heart failure: Secondary | ICD-10-CM

## 2012-09-05 MED ORDER — METOPROLOL TARTRATE 50 MG PO TABS: 50.0000 mg | ORAL_TABLET | Freq: Two times a day (BID) | ORAL | Status: DC

## 2012-11-19 ENCOUNTER — Encounter: Payer: Self-pay | Admitting: Cardiology

## 2012-12-07 ENCOUNTER — Other Ambulatory Visit: Payer: Self-pay | Admitting: Family Medicine

## 2013-01-16 ENCOUNTER — Other Ambulatory Visit: Payer: Self-pay | Admitting: Family Medicine

## 2013-02-11 ENCOUNTER — Encounter: Payer: Self-pay | Admitting: Family Medicine

## 2013-02-11 ENCOUNTER — Ambulatory Visit (INDEPENDENT_AMBULATORY_CARE_PROVIDER_SITE_OTHER): Payer: Medicare Other | Admitting: Family Medicine

## 2013-02-11 VITALS — BP 124/80 | HR 68 | Wt 167.0 lb

## 2013-02-11 DIAGNOSIS — I10 Essential (primary) hypertension: Secondary | ICD-10-CM

## 2013-02-11 DIAGNOSIS — E7800 Pure hypercholesterolemia, unspecified: Secondary | ICD-10-CM

## 2013-02-11 DIAGNOSIS — Z79899 Other long term (current) drug therapy: Secondary | ICD-10-CM

## 2013-02-11 DIAGNOSIS — M1990 Unspecified osteoarthritis, unspecified site: Secondary | ICD-10-CM

## 2013-02-11 DIAGNOSIS — M949 Disorder of cartilage, unspecified: Secondary | ICD-10-CM

## 2013-02-11 DIAGNOSIS — M8580 Other specified disorders of bone density and structure, unspecified site: Secondary | ICD-10-CM

## 2013-02-11 DIAGNOSIS — Z23 Encounter for immunization: Secondary | ICD-10-CM

## 2013-02-11 LAB — CBC WITH DIFFERENTIAL/PLATELET
Basophils Absolute: 0.1 10*3/uL (ref 0.0–0.1)
Basophils Relative: 1 % (ref 0–1)
Hemoglobin: 14.4 g/dL (ref 12.0–15.0)
MCHC: 33.9 g/dL (ref 30.0–36.0)
Monocytes Relative: 8 % (ref 3–12)
Neutro Abs: 6 10*3/uL (ref 1.7–7.7)
Neutrophils Relative %: 64 % (ref 43–77)
RDW: 15.2 % (ref 11.5–15.5)

## 2013-02-11 NOTE — Progress Notes (Signed)
  Subjective:    Patient ID: AERIS HERSMAN, female    DOB: 29-Apr-1935, 78 y.o.   MRN: [id number]  HPI She is here for medication check. She has no particular concerns or complaints. She continues on medications listed in the chart. She's had no more difficulty with chest pain, shortness of breath, arthritic type symptoms.   Review of Systems Negative    Objective:   Physical Exam alert and in no distress. Tympanic membranes and canals are normal. Throat is clear. Tonsils are normal. Neck is supple without adenopathy or thyromegaly. Cardiac exam shows a regular sinus rhythm without murmurs or gallops. Lungs are clear to auscultation. DTRs are normal.       Assessment & Plan:  Osteoarthritis  Hypercholesterolemia - Plan: Lipid panel  Hypertension - Plan: CBC with Differential, Comprehensive metabolic panel  Osteopenia - Plan: Vitamin D 25 hydroxy  Encounter for long-term (current) use of other medications - Plan: Pneumococcal polysaccharide vaccine 23-valent greater than or equal to 2yo subcutaneous/IM  I encouraged her to maintain her active lifestyle.

## 2013-02-12 ENCOUNTER — Other Ambulatory Visit: Payer: Self-pay | Admitting: Family Medicine

## 2013-02-12 LAB — VITAMIN D 25 HYDROXY (VIT D DEFICIENCY, FRACTURES): Vit D, 25-Hydroxy: 33 ng/mL (ref 30–89)

## 2013-02-12 LAB — COMPREHENSIVE METABOLIC PANEL
ALT: 13 U/L (ref 0–35)
AST: 16 U/L (ref 0–37)
Albumin: 4.3 g/dL (ref 3.5–5.2)
Alkaline Phosphatase: 55 U/L (ref 39–117)
Potassium: 4.3 mEq/L (ref 3.5–5.3)
Sodium: 141 mEq/L (ref 135–145)
Total Protein: 6.8 g/dL (ref 6.0–8.3)

## 2013-02-12 LAB — LIPID PANEL: LDL Cholesterol: 109 mg/dL — ABNORMAL HIGH (ref 0–99)

## 2013-02-12 NOTE — Progress Notes (Signed)
Quick Note:  MAILED PT LETTER OF LAB RESULTS ______ 

## 2013-03-19 ENCOUNTER — Other Ambulatory Visit: Payer: Self-pay | Admitting: Family Medicine

## 2013-04-04 ENCOUNTER — Ambulatory Visit (INDEPENDENT_AMBULATORY_CARE_PROVIDER_SITE_OTHER): Payer: Medicare Other | Admitting: Family Medicine

## 2013-04-04 ENCOUNTER — Encounter: Payer: Self-pay | Admitting: Family Medicine

## 2013-04-04 VITALS — BP 128/78 | HR 64 | Temp 97.7°F | Wt 165.0 lb

## 2013-04-04 DIAGNOSIS — J209 Acute bronchitis, unspecified: Secondary | ICD-10-CM

## 2013-04-04 DIAGNOSIS — R300 Dysuria: Secondary | ICD-10-CM

## 2013-04-04 LAB — POCT URINALYSIS DIPSTICK
Ketones, UA: NEGATIVE
Leukocytes, UA: NEGATIVE
Nitrite, UA: NEGATIVE
Protein, UA: NEGATIVE

## 2013-04-04 MED ORDER — CLARITHROMYCIN 500 MG PO TABS: 500.0000 mg | ORAL_TABLET | Freq: Two times a day (BID) | ORAL | Status: DC

## 2013-04-04 NOTE — Progress Notes (Signed)
  Subjective:    Patient ID: Anna Acevedo, female    DOB: 16-Sep-1934, 78 y.o.   MRN: [id number]  HPI She has a one-week history of nasal congestion, rhinorrhea and coughing. No sore throat, earache, no fever but chills. She also has a week long history of dysuria, frequency ,urgency. She does not smoke and has no allergies  Review of Systems     Objective:   Physical Exam alert and in no distress. Tympanic membranes and canals are normal. Throat is clear. Tonsils are normal. Neck is supple without adenopathy or thyromegaly. Cardiac exam shows a regular sinus rhythm without murmurs or gallops. Lungs are clear to auscultation. Urine microscopic was negative       Assessment & Plan:  Burning with urination - Plan: POCT Urinalysis Dipstick, clarithromycin (BIAXIN) 500 MG tablet  Acute bronchitis - Plan: clarithromycin (BIAXIN) 500 MG tablet  I will cover her for both UTI and bronchitis with erythromycin in spite of a negative urine dipstick and microscopic.

## 2013-04-04 NOTE — Patient Instructions (Signed)
Use Azo-Standard to help with the bladder problem

## 2013-05-23 ENCOUNTER — Other Ambulatory Visit: Payer: Self-pay | Admitting: Family Medicine

## 2013-05-23 DIAGNOSIS — Z1231 Encounter for screening mammogram for malignant neoplasm of breast: Secondary | ICD-10-CM

## 2013-05-27 ENCOUNTER — Telehealth: Payer: Self-pay | Admitting: Family Medicine

## 2013-05-27 ENCOUNTER — Ambulatory Visit (HOSPITAL_BASED_OUTPATIENT_CLINIC_OR_DEPARTMENT_OTHER)
Admission: RE | Admit: 2013-05-27 | Discharge: 2013-05-27 | Disposition: A | Discharge status: Home / Self Care | Payer: Medicare Other | Source: Ambulatory Visit | Attending: Family Medicine | Admitting: Family Medicine

## 2013-05-27 ENCOUNTER — Other Ambulatory Visit: Payer: Self-pay

## 2013-05-27 DIAGNOSIS — Z1231 Encounter for screening mammogram for malignant neoplasm of breast: Secondary | ICD-10-CM | POA: Insufficient documentation

## 2013-05-27 MED ORDER — PRAVASTATIN SODIUM 40 MG PO TABS: 40.0000 mg | ORAL_TABLET | Freq: Every day | ORAL | Status: DC

## 2013-05-27 MED ORDER — LISINOPRIL-HYDROCHLOROTHIAZIDE 10-12.5 MG PO TABS: ORAL_TABLET | ORAL | Status: DC

## 2013-05-27 NOTE — Telephone Encounter (Addendum)
Pt is requesting a #90 day fill on both Lisinopril-HCTZ 10-12.5mg  and Pravastatin 40 mg instead of every #30.  She uses [facility] off [address]

## 2013-05-27 NOTE — Telephone Encounter (Signed)
SENT MED IN FOR 90 DAYS

## 2013-08-21 ENCOUNTER — Ambulatory Visit (INDEPENDENT_AMBULATORY_CARE_PROVIDER_SITE_OTHER): Payer: Medicare Other | Admitting: Cardiology

## 2013-08-21 ENCOUNTER — Encounter: Payer: Self-pay | Admitting: Cardiology

## 2013-08-21 VITALS — BP 135/56 | HR 60 | Ht 64.0 in | Wt 170.0 lb

## 2013-08-21 DIAGNOSIS — E7800 Pure hypercholesterolemia, unspecified: Secondary | ICD-10-CM

## 2013-08-21 DIAGNOSIS — R002 Palpitations: Secondary | ICD-10-CM

## 2013-08-21 DIAGNOSIS — I119 Hypertensive heart disease without heart failure: Secondary | ICD-10-CM

## 2013-08-21 NOTE — Patient Instructions (Signed)
Your physician recommends that you continue on your current medications as directed. Please refer to the Current Medication list given to you today.  Your physician wants you to follow-up in: [place] will receive a reminder letter in the mail two months in advance. If you don't receive a letter, please call our office to schedule the follow-up appointment.

## 2013-08-21 NOTE — Progress Notes (Signed)
Anna Acevedo Date of Birth:  11/01/1934 [address] [place],   [address] [phone]  Fax   [phone]  HPI:  This pleasant 79 year old woman is seen for a scheduled 12 month followup office visit. She has a history of essential hypertension, hypercholesterolemia, and palpitations. She does not have any history of ischemic heart disease and she had a normal nuclear stress test in 2007.  She had An echocardiogram in 2007 showing normal systolic function with an ejection fraction of 55-60% and impaired relaxation. She also had mild aortic sclerosis and mild mitral regurgitation. She did have frequent PVCs at the time of that study and she was subsequently placed on beta blocker with resolution of the PVCs. Since last visit she has been feeling well. Denies any chest pain or shortness of breath.  Since last visit she has gained about 4 pounds. Current Outpatient Prescriptions  Medication Sig Dispense Refill  . acetaminophen (TYLENOL) 500 MG tablet Take 500 mg by mouth every 6 (six) hours as needed.      Marland Kitchen aspirin 81 MG tablet Take 81 mg by mouth daily.        Marland Kitchen CALCIUM PO Take by mouth. 600 plus d      . lisinopril-hydrochlorothiazide (PRINZIDE,ZESTORETIC) 10-12.5 MG per tablet TAKE 1 TABLET EVERY DAY  90 tablet  3  . metoprolol (LOPRESSOR) 50 MG tablet Take 1 tablet (50 mg total) by mouth 2 (two) times daily.  180 tablet  3  . Multiple Vitamin (MULTIVITAMIN PO) Take by mouth.       . NONFORMULARY OR COMPOUNDED ITEM Estradiol .02% 1 ML Prefilled Applicator Sig: apply vaginally twice a week #90 Day Supply with 4 refills  1 each  4  . pravastatin (PRAVACHOL) 40 MG tablet Take 1 tablet (40 mg total) by mouth daily.  90 tablet  3   No current facility-administered medications for this visit.    Allergies  Allergen Reactions  . Penicillins Hives and Swelling  . Zocor [Simvastatin]     Muscle Pain  . Amoxicillin Rash  . Cephalexin Rash    Patient Active  Problem List   Diagnosis Date Noted  . Vulvovaginitis, estrogen deficient 07/24/2012  . Post menopausal syndrome 07/24/2012  . Hypertension 09/19/2011  . Osteopenia 09/19/2011  . Osteoarthritis 08/16/2011  . Bronchitis 06/22/2011  . Benign hypertensive heart disease without heart failure 02/17/2011  . Hypercholesterolemia 02/17/2011  . Sciatica of right side 02/17/2011  . Palpitations 02/17/2011    History  Smoking status  . Never Smoker   Smokeless tobacco  . Never Used    History  Alcohol Use No    Family History  Problem Relation Age of Onset  . Hypertension Mother     Review of Systems: The patient denies any heat or cold intolerance.  No weight gain or weight loss.  The patient denies headaches or blurry vision.  There is no cough or sputum production.  The patient denies dizziness.  There is no hematuria or hematochezia.  The patient denies any muscle aches or arthritis.  The patient denies any rash.  The patient denies frequent falling or instability.  There is no history of depression or anxiety.  All other systems were reviewed and are negative.   Physical Exam: Filed Vitals:   08/21/13 0952  BP: 135/56  Pulse: 60   the general appearance reveals a well-developed well-nourished elderly woman in no distress.The head and neck exam reveals pupils equal and reactive.  Extraocular movements are full.  There is no scleral icterus.  The mouth and pharynx are normal.  The neck is supple.  The carotids reveal no bruits.  The jugular venous pressure is normal.  The  thyroid is not enlarged.  There is no lymphadenopathy.  The chest is clear to percussion and auscultation.  There are no rales or rhonchi.  Expansion of the chest is symmetrical.  The precordium is quiet.  The first heart sound is normal.  The second heart sound is physiologically split.  There is no murmur gallop rub or click.  There is no abnormal lift or heave.  The abdomen is soft and nontender.  The bowel sounds  are normal.  The liver and spleen are not enlarged.  There are no abdominal masses.  There are no abdominal bruits.  Extremities reveal good pedal pulses.  There is no phlebitis or edema.  There is no cyanosis or clubbing.  Strength is normal and symmetrical in all extremities.  There is no lateralizing weakness.  There are no sensory deficits.  The skin is warm and dry.  There is no rash.   EKG shows sinus rhythm and is within normal limits and is unchanged since 08/16/11   Assessment / Plan: Continue same medication.  Recheck in one year for office visit and EKG

## 2013-08-21 NOTE — Assessment & Plan Note (Signed)
Blood pressure has been remaining stable on current medication.  She is not having palpitations dizziness or syncope.  No symptoms of CHF.  She continues to stay active doing housework.

## 2013-08-21 NOTE — Assessment & Plan Note (Signed)
The patient has not been aware of any racing of her heart or palpitations.  EKG today shows normal sinus rhythm

## 2013-08-21 NOTE — Assessment & Plan Note (Signed)
Patient has a history of hypercholesterolemia and is on pravastatin followed by her PCP.  She is not having any myalgias.

## 2013-09-09 ENCOUNTER — Other Ambulatory Visit: Payer: Self-pay | Admitting: Cardiology

## 2013-10-08 ENCOUNTER — Other Ambulatory Visit: Payer: Self-pay | Admitting: Gynecology

## 2013-10-08 MED ORDER — NONFORMULARY OR COMPOUNDED ITEM: Status: DC

## 2013-10-29 ENCOUNTER — Encounter: Payer: Self-pay | Admitting: Gynecology

## 2013-10-29 ENCOUNTER — Ambulatory Visit (INDEPENDENT_AMBULATORY_CARE_PROVIDER_SITE_OTHER): Payer: Medicare Other | Admitting: Gynecology

## 2013-10-29 VITALS — BP 126/84 | Ht 63.75 in | Wt 172.6 lb

## 2013-10-29 DIAGNOSIS — N952 Postmenopausal atrophic vaginitis: Secondary | ICD-10-CM

## 2013-10-29 DIAGNOSIS — M899 Disorder of bone, unspecified: Secondary | ICD-10-CM

## 2013-10-29 DIAGNOSIS — M949 Disorder of cartilage, unspecified: Secondary | ICD-10-CM

## 2013-10-29 DIAGNOSIS — M8580 Other specified disorders of bone density and structure, unspecified site: Secondary | ICD-10-CM

## 2013-10-29 DIAGNOSIS — Z780 Asymptomatic menopausal state: Secondary | ICD-10-CM

## 2013-10-29 NOTE — Progress Notes (Signed)
Anna Acevedo Apr 02, 1935 [id number]   History:    79 y.o. for followup GYN exam. Patient has been on vaginal estrogen to help with her atrophy and irritation which she states has helped her.Patient was seen as a new patient to our practice last year she was a former patient of Dr. Ree Edman.Patient stated in the past she had been on hormone replacement therapy for  6  years. Patient's last colonoscopy was in 2009 and was normal.  Patient last bone density study 2014 demonstrated her lowest T score was -2.1 at the [facility]. She had a normal Frax Analysis. Patient's PCP is Dr. Redmond School.   Past medical history,surgical history, family history and social history were all reviewed and documented in the EPIC chart.  Gynecologic History No LMP recorded. Patient is postmenopausal. Contraception: post menopausal status Last Pap: many years ago. Results were: normal Last mammogram: 2014. Results were: normal  Obstetric History OB History  Gravida Para Term Preterm AB SAB TAB Ectopic Multiple Living  2 2 2       2     # Outcome Date GA Lbr Len/2nd Weight Sex Delivery Anes PTL Lv  2 TRM     F SVD  N Y  1 TRM     M SVD  N Y       ROS: A ROS was performed and pertinent positives and negatives are included in the history.  GENERAL: No fevers or chills. HEENT: No change in vision, no earache, sore throat or sinus congestion. NECK: No pain or stiffness. CARDIOVASCULAR: No chest pain or pressure. No palpitations. PULMONARY: No shortness of breath, cough or wheeze. GASTROINTESTINAL: No abdominal pain, nausea, vomiting or diarrhea, melena or bright red blood per rectum. GENITOURINARY: No urinary frequency, urgency, hesitancy or dysuria. MUSCULOSKELETAL: No joint or muscle pain, no back pain, no recent trauma. DERMATOLOGIC: No rash, no itching, no lesions. ENDOCRINE: No polyuria, polydipsia, no heat or cold intolerance. No recent change in weight. HEMATOLOGICAL: No anemia or easy bruising or bleeding.  NEUROLOGIC: No headache, seizures, numbness, tingling or weakness. PSYCHIATRIC: No depression, no loss of interest in normal activity or change in sleep pattern.     Exam: chaperone present  BP 126/84  Ht 5' 3.75" (1.619 m)  Wt 172 lb 9.6 oz (78.291 kg)  BMI 29.87 kg/m2  Body mass index is 29.87 kg/(m^2).  General appearance : Well developed well nourished female. No acute distress HEENT: Neck supple, trachea midline, no carotid bruits, no thyroidmegaly Lungs: Clear to auscultation, no rhonchi or wheezes, or rib retractions  Heart: Regular rate and rhythm, no murmurs or gallops Breast:Examined in sitting and supine position were symmetrical in appearance, no palpable masses or tenderness,  no skin retraction, no nipple inversion, no nipple discharge, no skin discoloration, no axillary or supraclavicular lymphadenopathy Abdomen: no palpable masses or tenderness, no rebound or guarding Extremities: no edema or skin discoloration or tenderness  Pelvic:  Bartholin, Urethra, Skene Glands: Within normal limits             Vagina: No gross lesions or discharge, mild vaginal atrophy  Cervix: No gross lesions or discharge  Uterus  anteverted, normal size, shape and consistency, non-tender and mobile  Adnexa  Without masses or tenderness  Anus and perineum  normal   Rectovaginal  normal sphincter tone without palpated masses or tenderness             Hemoccult -2014 PCA we will provide from now on  Assessment/Plan:  79 y.o. female has done well with vaginal estrogen twice a week for her vaginitis and atrophy and irritation. Pap smear not done due to new guidelines. Patient will need her next bone density study next year. We discussed importance of calcium and vitamin D and regular exercise for osteoporosis prevention. She is due for a mammogram later this year. Her PCP will be drawn her blood work. Her vaccines are up-to-date.  Note: This dictation was prepared with  Dragon/digital  dictation along withSmart phrase technology. Any transcriptional errors that result from this process are unintentional.   Terrance Mass MD, 2:36 PM 10/29/2013

## 2014-01-07 ENCOUNTER — Other Ambulatory Visit: Payer: Self-pay | Admitting: Gynecology

## 2014-03-16 ENCOUNTER — Other Ambulatory Visit: Payer: Self-pay | Admitting: Family Medicine

## 2014-04-10 ENCOUNTER — Telehealth: Payer: Self-pay | Admitting: Family Medicine

## 2014-04-10 NOTE — Telephone Encounter (Signed)
Pt called and made a med check app She needs pravastatin filled until then. Please send to [facility].

## 2014-04-11 ENCOUNTER — Other Ambulatory Visit: Payer: Self-pay

## 2014-04-11 MED ORDER — PRAVASTATIN SODIUM 40 MG PO TABS: 40.0000 mg | ORAL_TABLET | Freq: Every day | ORAL | Status: DC

## 2014-04-11 NOTE — Telephone Encounter (Signed)
DONE

## 2014-04-12 ENCOUNTER — Other Ambulatory Visit: Payer: Self-pay | Admitting: Family Medicine

## 2014-04-22 ENCOUNTER — Encounter: Payer: Medicare Other | Admitting: Medical

## 2014-04-28 ENCOUNTER — Ambulatory Visit (INDEPENDENT_AMBULATORY_CARE_PROVIDER_SITE_OTHER): Payer: Medicare Other | Admitting: Family Medicine

## 2014-04-28 ENCOUNTER — Encounter: Payer: Self-pay | Admitting: Family Medicine

## 2014-04-28 VITALS — BP 120/70 | HR 70 | Ht 63.0 in | Wt 169.0 lb

## 2014-04-28 DIAGNOSIS — M8580 Other specified disorders of bone density and structure, unspecified site: Secondary | ICD-10-CM

## 2014-04-28 DIAGNOSIS — M150 Primary generalized (osteo)arthritis: Secondary | ICD-10-CM

## 2014-04-28 DIAGNOSIS — I119 Hypertensive heart disease without heart failure: Secondary | ICD-10-CM

## 2014-04-28 DIAGNOSIS — M159 Polyosteoarthritis, unspecified: Secondary | ICD-10-CM

## 2014-04-28 DIAGNOSIS — E7800 Pure hypercholesterolemia, unspecified: Secondary | ICD-10-CM

## 2014-04-28 DIAGNOSIS — I10 Essential (primary) hypertension: Secondary | ICD-10-CM

## 2014-04-28 LAB — CBC WITH DIFFERENTIAL/PLATELET
BASOS ABS: 0 10*3/uL (ref 0.0–0.1)
Basophils Relative: 0 % (ref 0–1)
Eosinophils Absolute: 0.5 10*3/uL (ref 0.0–0.7)
Eosinophils Relative: 6 % — ABNORMAL HIGH (ref 0–5)
HCT: 42 % (ref 36.0–46.0)
Hemoglobin: 14.3 g/dL (ref 12.0–15.0)
LYMPHS PCT: 18 % (ref 12–46)
Lymphs Abs: 1.5 10*3/uL (ref 0.7–4.0)
MCH: 27.5 pg (ref 26.0–34.0)
MCHC: 34 g/dL (ref 30.0–36.0)
MCV: 80.8 fL (ref 78.0–100.0)
MONO ABS: 0.7 10*3/uL (ref 0.1–1.0)
Monocytes Relative: 8 % (ref 3–12)
NEUTROS ABS: 5.8 10*3/uL (ref 1.7–7.7)
Neutrophils Relative %: 68 % (ref 43–77)
Platelets: 231 10*3/uL (ref 150–400)
RBC: 5.2 MIL/uL — ABNORMAL HIGH (ref 3.87–5.11)
RDW: 15.2 % (ref 11.5–15.5)
WBC: 8.6 10*3/uL (ref 4.0–10.5)

## 2014-04-28 LAB — LIPID PANEL
Cholesterol: 163 mg/dL (ref 0–200)
HDL: 36 mg/dL — ABNORMAL LOW (ref >39)
LDL Cholesterol: 96 mg/dL (ref 0–99)
Total CHOL/HDL Ratio: 4.5 Ratio
Triglycerides: 156 mg/dL — ABNORMAL HIGH (ref <150)
VLDL: 31 mg/dL (ref 0–40)

## 2014-04-28 LAB — COMPREHENSIVE METABOLIC PANEL
ALT: 13 U/L (ref 0–35)
AST: 16 U/L (ref 0–37)
Albumin: 4.1 g/dL (ref 3.5–5.2)
Alkaline Phosphatase: 55 U/L (ref 39–117)
BUN: 13 mg/dL (ref 6–23)
CALCIUM: 9.5 mg/dL (ref 8.4–10.5)
CHLORIDE: 102 meq/L (ref 96–112)
CO2: 29 mEq/L (ref 19–32)
Creat: 0.89 mg/dL (ref 0.50–1.10)
Glucose, Bld: 96 mg/dL (ref 70–99)
POTASSIUM: 4.4 meq/L (ref 3.5–5.3)
SODIUM: 140 meq/L (ref 135–145)
TOTAL PROTEIN: 6.8 g/dL (ref 6.0–8.3)
Total Bilirubin: 0.6 mg/dL (ref 0.2–1.2)

## 2014-04-28 MED ORDER — LISINOPRIL-HYDROCHLOROTHIAZIDE 10-12.5 MG PO TABS: ORAL_TABLET | ORAL | Status: DC

## 2014-04-28 MED ORDER — PRAVASTATIN SODIUM 40 MG PO TABS: 40.0000 mg | ORAL_TABLET | Freq: Every day | ORAL | Status: DC

## 2014-04-28 MED ORDER — METOPROLOL TARTRATE 50 MG PO TABS: ORAL_TABLET | ORAL | Status: DC

## 2014-04-28 NOTE — Progress Notes (Signed)
   Subjective:    Patient ID: Anna Acevedo, female    DOB: Sep 19, 1934, 79 y.o.   MRN: [id number]  HPI She is here for a medication check. She has no particular concerns or complaints specifically no mental or psychological issues. Her physical activity is appropriate for her age. He does have some arthritic changes but not enough to interfere that much with her lifestyle. She continues on medications listed in the chart. Smoking and drinking were reviewed. Her home life is going quite well. She has been married for over 18 years.   Review of Systems     Objective:   Physical Exam alert and in no distress. Tympanic membranes and canals are normal. Throat is clear. Tonsils are normal. Neck is supple without adenopathy or thyromegaly. Cardiac exam shows a regular sinus rhythm without murmurs or gallops. Lungs are clear to auscultation. Immunizations were updated       Assessment & Plan:  Benign hypertensive heart disease without heart failure - Plan: CBC with Differential, Comprehensive metabolic panel, Lipid panel, lisinopril-hydrochlorothiazide (PRINZIDE,ZESTORETIC) 10-12.5 MG per tablet, metoprolol (LOPRESSOR) 50 MG tablet  Hypercholesterolemia - Plan: Lipid panel, pravastatin (PRAVACHOL) 40 MG tablet  Primary osteoarthritis involving multiple joints  Essential hypertension  Osteopenia  I will renew her medications. Encouraged her to continue to take good care of herself.

## 2014-05-19 ENCOUNTER — Encounter: Payer: Self-pay | Admitting: Family Medicine

## 2014-06-05 ENCOUNTER — Other Ambulatory Visit: Payer: Self-pay | Admitting: Family Medicine

## 2014-08-18 ENCOUNTER — Telehealth: Payer: Self-pay | Admitting: Family Medicine

## 2014-08-18 MED ORDER — PRAVASTATIN SODIUM 40 MG PO TABS: 40.0000 mg | ORAL_TABLET | Freq: Every day | ORAL | Status: DC

## 2014-08-21 ENCOUNTER — Encounter: Payer: Self-pay | Admitting: Cardiology

## 2014-08-21 ENCOUNTER — Ambulatory Visit (INDEPENDENT_AMBULATORY_CARE_PROVIDER_SITE_OTHER): Payer: Medicare Other | Admitting: Cardiology

## 2014-08-21 VITALS — BP 134/74 | HR 62 | Ht 63.0 in

## 2014-08-21 DIAGNOSIS — I119 Hypertensive heart disease without heart failure: Secondary | ICD-10-CM

## 2014-08-21 DIAGNOSIS — R002 Palpitations: Secondary | ICD-10-CM

## 2014-08-21 DIAGNOSIS — E7800 Pure hypercholesterolemia, unspecified: Secondary | ICD-10-CM

## 2014-08-21 NOTE — Patient Instructions (Signed)
Your physician recommends that you continue on your current medications as directed. Please refer to the Current Medication list given to you today.  Your physician wants you to follow-up in: [place] will receive a reminder letter in the mail two months in advance. If you don't receive a letter, please call our office to schedule the follow-up appointment.

## 2014-08-21 NOTE — Progress Notes (Signed)
Cardiology Office Note   Date:  08/21/2014   ID:  Anna Acevedo, DOB [birth year], MRN [id number]  PCP:  Wyatt Haste, MD  Cardiologist:   Darlin Coco, MD   No chief complaint on file.     History of Present Illness: Anna Acevedo is a 80 y.o. female who presents for a one-year follow-up office visit. This pleasant 80 year old woman is seen for a scheduled 12 month followup office visit. She has a history of essential hypertension, hypercholesterolemia, and palpitations. She does not have any history of ischemic heart disease and she had a normal nuclear stress test in 2007. She had an echocardiogram in 2007 showing normal systolic function with an ejection fraction of 55-60% and impaired relaxation. She also had mild aortic sclerosis and mild mitral regurgitation. She did have frequent PVCs at the time of that study and she was subsequently placed on beta blocker with resolution of the PVCs. Since last visit she has been feeling well. Denies any chest pain or shortness of breath.  Since last visit she has been feeling well from a cardiac standpoint.  She is not having any palpitations dizziness or syncope.  No chest pain or shortness of breath.  She is on statin therapy.  She is not having any myalgias.   Past Medical History  Diagnosis Date  . Hypertension     Essential  . Hypercholesterolemia   . Palpitations   . Dyspnea   . Colon polyp   . Obesity   . Osteoporosis     OSTEOPENIA  . Diverticulosis   . GERD (gastroesophageal reflux disease)     Past Surgical History  Procedure Laterality Date  . US echocardiography  September 2007    EF 55-60%. Showing  impaired diastolic relaxation and mild mitral regurgitation  . Cardiovascular stress test  03-30-2006    EF 72%. Showed no evidence of ischemia   . Colonoscopy      gets every 5 years  . Eye surgery Bilateral 2014    Cataracts     Current Outpatient Prescriptions  Medication Sig Dispense  Refill  . acetaminophen (TYLENOL) 500 MG tablet Take 500 mg by mouth every 6 (six) hours as needed.    Marland Kitchen aspirin 81 MG tablet Take 81 mg by mouth daily.      Marland Kitchen CALCIUM PO Take by mouth. 600 plus d    . lisinopril-hydrochlorothiazide (PRINZIDE,ZESTORETIC) 10-12.5 MG per tablet TAKE 1 TABLET EVERY DAY 90 tablet 3  . metoprolol (LOPRESSOR) 50 MG tablet TAKE 1 TABLET TWICE A DAY 180 tablet 3  . Multiple Vitamin (MULTIVITAMIN PO) Take by mouth.     . NONFORMULARY OR COMPOUNDED ITEM Insert 2ml into vagina twice a week/ 24 each 8  . pravastatin (PRAVACHOL) 40 MG tablet Take 1 tablet (40 mg total) by mouth daily. 90 tablet 3   No current facility-administered medications for this visit.    Allergies:   Penicillins; Zocor; Amoxicillin; and Cephalexin    Social History:  The patient  reports that she has never smoked. She has never used smokeless tobacco. She reports that she does not drink alcohol or use illicit drugs.   Family History:  The patient's family history includes Hypertension in her mother.    ROS:  Please see the history of present illness.   Otherwise, review of systems are positive for none.   All other systems are reviewed and negative.    PHYSICAL EXAM: VS:  BP 134/74 mmHg  Pulse 62  Ht 5\' 3"  (1.6 m) , BMI There is no weight on file to calculate BMI. GEN: Well nourished, well developed, in no acute distress HEENT: normal Neck: no JVD, carotid bruits, or masses Cardiac: RRR; no murmurs, rubs, or gallops,no edema  Respiratory:  clear to auscultation bilaterally, normal work of breathing GI: soft, nontender, nondistended, + BS MS: no deformity or atrophy Skin: warm and dry, no rash Neuro:  Strength and sensation are intact Psych: euthymic mood, full affect   EKG:  EKG is ordered today. The ekg ordered today demonstrates normal sinus rhythm.  No ischemic changes.   Recent Labs: 04/28/2014: ALT 13; BUN 13; Creatinine 0.89; Hemoglobin 14.3; Platelets 231; Potassium 4.4;  Sodium 140    Lipid Panel    Component Value Date/Time   CHOL 163 04/28/2014 0858   TRIG 156* 04/28/2014 0858   HDL 36* 04/28/2014 0858   CHOLHDL 4.5 04/28/2014 0858   VLDL 31 04/28/2014 0858   LDLCALC 96 04/28/2014 0858      Wt Readings from Last 3 Encounters:  04/28/14 169 lb (76.658 kg)  10/29/13 172 lb 9.6 oz (78.291 kg)  08/21/13 170 lb (77.111 kg)      Other studies Reviewed:    ASSESSMENT AND PLAN: 1.  Essential hypertension without heart failure 2.  Hypercholesterolemia 3.  Past history of palpitations 4.  History of PVCs    Current medicines are reviewed at length with the patient today.  The patient does not have concerns regarding medicines.  The following changes have been made:  no change  Labs/ tests ordered today include:   Orders Placed This Encounter  Procedures  . EKG 12-Lead    The patient is doing well.  She is not experiencing any palpitations.  No PVCs seen on EKG today. Disposition:   FU with Dr. Mare Ferrari in 1 Year for office visit and EKG.  Her lipids are followed by her PCP Dr. Redmond School   Signed, Darlin Coco, MD  08/21/2014 10:40 AM    [facility], [place],   [address] Phone: [phone]; Fax: [phone]

## 2014-09-02 ENCOUNTER — Other Ambulatory Visit: Payer: Self-pay | Admitting: Cardiology

## 2014-09-03 ENCOUNTER — Other Ambulatory Visit: Payer: Self-pay

## 2014-09-03 DIAGNOSIS — I119 Hypertensive heart disease without heart failure: Secondary | ICD-10-CM

## 2014-09-03 MED ORDER — METOPROLOL TARTRATE 50 MG PO TABS: ORAL_TABLET | ORAL | Status: DC

## 2014-11-13 ENCOUNTER — Encounter: Payer: Self-pay | Admitting: Gynecology

## 2014-11-13 ENCOUNTER — Ambulatory Visit (INDEPENDENT_AMBULATORY_CARE_PROVIDER_SITE_OTHER): Payer: Medicare Other | Admitting: Gynecology

## 2014-11-13 VITALS — BP 128/86 | Ht 62.5 in | Wt 176.0 lb

## 2014-11-13 DIAGNOSIS — Z79890 Hormone replacement therapy (postmenopausal): Secondary | ICD-10-CM | POA: Diagnosis not present

## 2014-11-13 DIAGNOSIS — M8580 Other specified disorders of bone density and structure, unspecified site: Secondary | ICD-10-CM | POA: Diagnosis not present

## 2014-11-13 DIAGNOSIS — N952 Postmenopausal atrophic vaginitis: Secondary | ICD-10-CM

## 2014-11-13 DIAGNOSIS — Z01419 Encounter for gynecological examination (general) (routine) without abnormal findings: Secondary | ICD-10-CM | POA: Diagnosis not present

## 2014-11-13 NOTE — Patient Instructions (Signed)

## 2014-11-13 NOTE — Progress Notes (Signed)
Anna Acevedo 1934/09/14 [id number]   History:    80 y.o.  for annual gyn exam with no complaints today.Patient has been on vaginal estrogen to help with her atrophy and irritation which she states has helped her.Patient was seen as a new patient to our practice last year she was a former patient of Dr. Ree Edman.Patient stated in the past she had been on hormone replacement therapy for 6 years. Patient's last colonoscopy was in 2009 and was normal. Patient last bone density study 2014 demonstrated her lowest T score was -2.1 at the [facility]. She had a normal Frax Analysis. Patient's PCP is Dr. Redmond School. Patient with no prior history of any abnormal Pap smears. Patient reports normal colonoscopy in 2009.  Past medical history,surgical history, family history and social history were all reviewed and documented in the EPIC chart.  Gynecologic History No LMP recorded. Patient is postmenopausal. Contraception: post menopausal status Last Pap: Several years ago. Results were: normal Last mammogram: 2014. Results were: normal  Obstetric History OB History  Gravida Para Term Preterm AB SAB TAB Ectopic Multiple Living  2 2 2       2     # Outcome Date GA Lbr Len/2nd Weight Sex Delivery Anes PTL Lv  2 Term     F Vag-Spont  N Y  1 Term     M Vag-Spont  N Y       ROS: A ROS was performed and pertinent positives and negatives are included in the history.  GENERAL: No fevers or chills. HEENT: No change in vision, no earache, sore throat or sinus congestion. NECK: No pain or stiffness. CARDIOVASCULAR: No chest pain or pressure. No palpitations. PULMONARY: No shortness of breath, cough or wheeze. GASTROINTESTINAL: No abdominal pain, nausea, vomiting or diarrhea, melena or bright red blood per rectum. GENITOURINARY: No urinary frequency, urgency, hesitancy or dysuria. MUSCULOSKELETAL: No joint or muscle pain, no back pain, no recent trauma. DERMATOLOGIC: No rash, no itching, no lesions. ENDOCRINE:  No polyuria, polydipsia, no heat or cold intolerance. No recent change in weight. HEMATOLOGICAL: No anemia or easy bruising or bleeding. NEUROLOGIC: No headache, seizures, numbness, tingling or weakness. PSYCHIATRIC: No depression, no loss of interest in normal activity or change in sleep pattern.     Exam: chaperone present  BP 128/86 mmHg  Ht 5' 2.5" (1.588 m)  Wt 176 lb (79.833 kg)  BMI 31.66 kg/m2  Body mass index is 31.66 kg/(m^2).  General appearance : Well developed well nourished female. No acute distress HEENT: Eyes: no retinal hemorrhage or exudates,  Neck supple, trachea midline, no carotid bruits, no thyroidmegaly Lungs: Clear to auscultation, no rhonchi or wheezes, or rib retractions  Heart: Regular rate and rhythm, no murmurs or gallops Breast:Examined in sitting and supine position were symmetrical in appearance, no palpable masses or tenderness,  no skin retraction, no nipple inversion, no nipple discharge, no skin discoloration, no axillary or supraclavicular lymphadenopathy Abdomen: no palpable masses or tenderness, no rebound or guarding Extremities: no edema or skin discoloration or tenderness  Pelvic:  Bartholin, Urethra, Skene Glands: Within normal limits             Vagina: No gross lesions or discharge, atrophic changes  Cervix: No gross lesions or discharge  Uterus  anteverted, normal size, shape and consistency, non-tender and mobile  Adnexa  Without masses or tenderness  Anus and perineum  normal   Rectovaginal  normal sphincter tone without palpated masses or tenderness  Hemoccult PCP provides     Assessment/Plan:  80 y.o. female for annual exam who has done well on vaginal estrogen twice a week. She is taking her calcium and vitamin D daily and is active by exercising several times a week. Her PCP is been doing her blood work. Patient with history of osteopenia. Patient to schedule bone density study here in the office the next few  weeks.   Terrance Mass MD, 2:27 PM 11/13/2014

## 2014-11-20 NOTE — Telephone Encounter (Signed)
dt ?

## 2014-12-09 ENCOUNTER — Ambulatory Visit (INDEPENDENT_AMBULATORY_CARE_PROVIDER_SITE_OTHER): Payer: Medicare Other

## 2014-12-09 DIAGNOSIS — M8580 Other specified disorders of bone density and structure, unspecified site: Secondary | ICD-10-CM

## 2014-12-19 ENCOUNTER — Telehealth: Payer: Self-pay | Admitting: Family Medicine

## 2014-12-19 ENCOUNTER — Other Ambulatory Visit: Payer: Self-pay

## 2014-12-19 NOTE — Telephone Encounter (Signed)
Go ahead and set this up but make sure that we get a copy of the DEXA

## 2014-12-19 NOTE — Telephone Encounter (Signed)
I have called and left an message that Dr.Lalonde said it would be okay i asked for her to bring RX from Dr. For DX codes and also if she would call that Dr .Gabriel Carina and have them send [place] a copy of the DEXA scan to please call and make a nurse vist appointment

## 2014-12-19 NOTE — Telephone Encounter (Signed)
Pt saw Dr Uvaldo Rising recently and had a bone density. Dr Toney Rakes now wants pt to have labs work to have calcium, vitamin D & PTH checked. Can pt come in for lab only appt to have this checked?

## 2014-12-22 ENCOUNTER — Other Ambulatory Visit: Payer: Medicare Other

## 2014-12-22 ENCOUNTER — Telehealth: Payer: Self-pay

## 2014-12-22 DIAGNOSIS — M810 Age-related osteoporosis without current pathological fracture: Secondary | ICD-10-CM | POA: Diagnosis not present

## 2014-12-22 NOTE — Telephone Encounter (Signed)
Patient called stating PCP is drawing her labs and wants [place] to send copy of her Dexa scan.  I told her that her PCP in is [facility] and can view her chart and see the results under imaging so no need to send.

## 2014-12-23 LAB — PTH, INTACT AND CALCIUM
CALCIUM: 9.5 mg/dL (ref 8.4–10.5)
PTH: 35 pg/mL (ref 14–64)

## 2014-12-23 LAB — VITAMIN D 25 HYDROXY (VIT D DEFICIENCY, FRACTURES): VIT D 25 HYDROXY: 21 ng/mL — AB (ref 30–100)

## 2014-12-23 MED ORDER — VITAMIN D (ERGOCALCIFEROL) 1.25 MG (50000 UNIT) PO CAPS: 50000.0000 [IU] | ORAL_CAPSULE | ORAL | Status: DC

## 2015-01-08 DIAGNOSIS — R42 Dizziness and giddiness: Secondary | ICD-10-CM | POA: Diagnosis not present

## 2015-02-05 ENCOUNTER — Telehealth: Payer: Self-pay | Admitting: Family Medicine

## 2015-02-05 NOTE — Telephone Encounter (Signed)
Pt has taken 6 of the Vitamin D pills. She wants to know when she should come back in for labs so she can get more Vit D. Does she come in for labs before running out of meds or after she finishes the meds she has?

## 2015-02-05 NOTE — Telephone Encounter (Signed)
Have her come in after she finishes the medicine.

## 2015-02-06 NOTE — Telephone Encounter (Signed)
Pt informed has appointment 8/4

## 2015-02-19 ENCOUNTER — Encounter: Payer: Self-pay | Admitting: Family Medicine

## 2015-02-19 ENCOUNTER — Ambulatory Visit (INDEPENDENT_AMBULATORY_CARE_PROVIDER_SITE_OTHER): Payer: Medicare Other | Admitting: Family Medicine

## 2015-02-19 VITALS — BP 116/70 | HR 60

## 2015-02-19 DIAGNOSIS — M8580 Other specified disorders of bone density and structure, unspecified site: Secondary | ICD-10-CM

## 2015-02-19 DIAGNOSIS — E559 Vitamin D deficiency, unspecified: Secondary | ICD-10-CM

## 2015-02-19 NOTE — Patient Instructions (Signed)
Use Centrum or Theragran Silver

## 2015-02-19 NOTE — Progress Notes (Signed)
   Subjective:    Patient ID: Anna Acevedo, female    DOB: 06-17-1935, 80 y.o.   MRN: [id number]  HPI She is here for follow-up visit. She did have a low vitamin D level and was placed on 50,000 units of vitamin D. She has had no difficulty with that. Review of Systems     Objective:   Physical Exam  Alert and in no distress otherwise not examined        Assessment & Plan:  Osteopenia - Plan: Vit D  25 hydroxy (rtn osteoporosis monitoring)  Vitamin D insufficiency - Plan: Vit D  25 hydroxy (rtn osteoporosis monitoring)  I encouraged her to also add a multivitamin with extra vitamin D to her regimen.

## 2015-02-20 LAB — VITAMIN D 25 HYDROXY (VIT D DEFICIENCY, FRACTURES): Vit D, 25-Hydroxy: 33 ng/mL (ref 30–100)

## 2015-02-24 ENCOUNTER — Other Ambulatory Visit: Payer: Self-pay | Admitting: Gynecology

## 2015-02-24 MED ORDER — NONFORMULARY OR COMPOUNDED ITEM: Status: DC

## 2015-02-25 ENCOUNTER — Other Ambulatory Visit: Payer: Self-pay | Admitting: Gynecology

## 2015-05-18 ENCOUNTER — Other Ambulatory Visit: Payer: Self-pay | Admitting: Family Medicine

## 2015-05-23 ENCOUNTER — Other Ambulatory Visit: Payer: Self-pay | Admitting: Family Medicine

## 2015-08-17 ENCOUNTER — Other Ambulatory Visit: Payer: Self-pay | Admitting: Cardiology

## 2015-08-17 NOTE — Telephone Encounter (Signed)
Medication Detail      Disp Refills Start End     metoprolol (LOPRESSOR) 50 MG tablet 180 tablet 3 09/03/2014     Sig: TAKE 1 TABLET TWICE A DAY    E-Prescribing Status: Receipt confirmed by pharmacy (09/03/2014 8:37 AM EST)     Associated Diagnoses    Benign hypertensive heart disease without heart failure - Primary       Pharmacy    [id number] [place] - [address]   Patient has been getting 90 day supply but needs to keep scheduled follow up OV 08/26/2015 to receive 90 day supply. Pt can only get 30 day right now and last refill should be good until 09/04/2015 so going to put in notes to refill at appt.

## 2015-08-20 DIAGNOSIS — H5213 Myopia, bilateral: Secondary | ICD-10-CM | POA: Diagnosis not present

## 2015-08-20 DIAGNOSIS — H524 Presbyopia: Secondary | ICD-10-CM | POA: Diagnosis not present

## 2015-08-20 DIAGNOSIS — H01002 Unspecified blepharitis right lower eyelid: Secondary | ICD-10-CM | POA: Diagnosis not present

## 2015-08-25 ENCOUNTER — Other Ambulatory Visit: Payer: Self-pay | Admitting: Cardiology

## 2015-08-26 ENCOUNTER — Ambulatory Visit (INDEPENDENT_AMBULATORY_CARE_PROVIDER_SITE_OTHER): Payer: Medicare Other | Admitting: Cardiology

## 2015-08-26 ENCOUNTER — Encounter: Payer: Self-pay | Admitting: Cardiology

## 2015-08-26 VITALS — BP 140/80 | HR 70 | Ht 64.0 in | Wt 175.4 lb

## 2015-08-26 DIAGNOSIS — R002 Palpitations: Secondary | ICD-10-CM

## 2015-08-26 DIAGNOSIS — R05 Cough: Secondary | ICD-10-CM | POA: Diagnosis not present

## 2015-08-26 DIAGNOSIS — I119 Hypertensive heart disease without heart failure: Secondary | ICD-10-CM

## 2015-08-26 DIAGNOSIS — T464X5A Adverse effect of angiotensin-converting-enzyme inhibitors, initial encounter: Secondary | ICD-10-CM

## 2015-08-26 MED ORDER — METOPROLOL TARTRATE 50 MG PO TABS: 50.0000 mg | ORAL_TABLET | Freq: Two times a day (BID) | ORAL | Status: DC

## 2015-08-26 MED ORDER — LOSARTAN POTASSIUM-HCTZ 50-12.5 MG PO TABS: ORAL_TABLET | ORAL | Status: DC

## 2015-08-26 NOTE — Patient Instructions (Addendum)
Medication Instructions:  STOP PRINZIDE   START START LOSARTAN 50/12.5 MG 1/2 TABLET DAILY  Labwork: NONE  Testing/Procedures: NONE  Follow-Up: Your physician wants you to follow-up in: Jane will receive a reminder letter in the mail two months in advance. If you don't receive a letter, please call our office to schedule the follow-up appointment.  Any Other Special Instructions Will Be Listed Below (If Applicable). CONTINUE TO MONITOR BLOOD PRESSURE AT HOME AND CALL IF NO IMPROVEMENT   If you need a refill on your cardiac medications before your next appointment, please call your pharmacy.

## 2015-08-26 NOTE — Progress Notes (Signed)
Cardiology Office Note   Date:  08/26/2015   ID:  Anna Acevedo, DOB 1935/01/26, MRN [id number]  PCP:  Wyatt Haste, MD  Cardiologist: Darlin Coco MD  No chief complaint on file.     History of Present Illness: Anna Acevedo is a 81 y.o. female who presents for One-year follow-up visit  . She has a history of essential hypertension, hypercholesterolemia, and palpitations. She does not have any history of ischemic heart disease and she had a normal nuclear stress test in 2007. She had an echocardiogram in 2007 showing normal systolic function with an ejection fraction of 55-60% and impaired relaxation. She also had mild aortic sclerosis and mild mitral regurgitation. She did have frequent PVCs at the time of that study and she was subsequently placed on beta blocker with resolution of the PVCs. Since last visit she has been feeling well. Denies any chest pain or shortness of breath. Since last visit she has been feeling well from a cardiac standpoint. She is not having any palpitations dizziness or syncope. No chest pain or shortness of breath. She is on statin therapy. She is not having any myalgias. She has a dry hacking cough.  She suspects that it is from the lisinopril.  Past Medical History  Diagnosis Date  . Hypertension     Essential  . Hypercholesterolemia   . Palpitations   . Dyspnea   . Colon polyp   . Obesity   . Osteoporosis     OSTEOPENIA  . Diverticulosis   . GERD (gastroesophageal reflux disease)     Past Surgical History  Procedure Laterality Date  . US echocardiography  September 2007    EF 55-60%. Showing  impaired diastolic relaxation and mild mitral regurgitation  . Cardiovascular stress test  03-30-2006    EF 72%. Showed no evidence of ischemia   . Colonoscopy      gets every 5 years  . Eye surgery Bilateral 2014    Cataracts     Current Outpatient Prescriptions  Medication Sig Dispense Refill  . acetaminophen  (TYLENOL) 500 MG tablet Take 500 mg by mouth every 6 (six) hours as needed for mild pain.     Marland Kitchen aspirin 81 MG tablet Take 81 mg by mouth daily.      Marland Kitchen CALCIUM PO Take 1 tablet by mouth daily. 600 plus d    . metoprolol (LOPRESSOR) 50 MG tablet Take 1 tablet (50 mg total) by mouth 2 (two) times daily. 180 tablet 3  . Multiple Vitamin (MULTIVITAMIN PO) Take 1 tablet by mouth daily.     . NONFORMULARY OR COMPOUNDED ITEM Estradiol Vag Cream S: insert 1 ml into vagina twice weekly. 24 each 5  . pravastatin (PRAVACHOL) 40 MG tablet Take 1 tablet (40 mg total) by mouth daily. 90 tablet 3  . losartan-hydrochlorothiazide (HYZAAR) 50-12.5 MG tablet TAKE 1/2 TABLET BY MOUTH DAILY 45 tablet 3   No current facility-administered medications for this visit.    Allergies:   Lisinopril; Penicillins; Zocor; Amoxicillin; and Cephalexin    Social History:  The patient  reports that she has never smoked. She has never used smokeless tobacco. She reports that she does not drink alcohol or use illicit drugs.   Family History:  The patient's family history includes Hypertension in her mother.    ROS:  Please see the history of present illness.   Otherwise, review of systems are positive for none.   All other systems are reviewed and negative.  PHYSICAL EXAM: VS:  BP 140/80 mmHg  Pulse 70  Ht 5\' 4"  (1.626 m)  Wt 175 lb 6.4 oz (79.561 kg)  BMI 30.09 kg/m2 , BMI Body mass index is 30.09 kg/(m^2). GEN: Well nourished, well developed, in no acute distress HEENT: normal Neck: no JVD, carotid bruits, or masses Cardiac: RRR; no murmurs, rubs, or gallops,no edema  Respiratory:  clear to auscultation bilaterally, normal work of breathing GI: soft, nontender, nondistended, + BS MS: no deformity or atrophy Skin: warm and dry, no rash Neuro:  Strength and sensation are intact Psych: euthymic mood, full affect   EKG:  EKG is ordered today. The ekg ordered today demonstrates Normal sinus rhythm at 70 bpm.  No  ischemic changes.   Recent Labs: No results found for requested labs within last 365 days.    Lipid Panel    Component Value Date/Time   CHOL 163 04/28/2014 0858   TRIG 156* 04/28/2014 0858   HDL 36* 04/28/2014 0858   CHOLHDL 4.5 04/28/2014 0858   VLDL 31 04/28/2014 0858   LDLCALC 96 04/28/2014 0858      Wt Readings from Last 3 Encounters:  08/26/15 175 lb 6.4 oz (79.561 kg)  11/13/14 176 lb (79.833 kg)  04/28/14 169 lb (76.658 kg)        ASSESSMENT AND PLAN:  1. Essential hypertension without heart failure 2. Hypercholesterolemia 3. Past history of palpitations 4. History of PVCs 5.Dry cough suspected to be secondary to lisinopril   Current medicines are reviewed at length with the patient today.  The patient has concerns regarding medicines.  The following changes have been made:  Stop lisinopril.  Start losartan 50/12.5 mg tablets and she will start out taking just one half tablet daily.  She will monitor her blood pressure at home.  If it is elevated we can consider  increasing her to a full tablet  Labs/ tests ordered today include:   Orders Placed This Encounter  Procedures  . EKG 12-Lead     Disposition:   Following my retirement she will follow-up in one year for office visit and EKG with Dr. Debara Pickett  Signed, Darlin Coco MD 08/26/2015 6:15 PM    [address], [place], [place]  [address] Phone: [phone]; Fax: [phone]

## 2015-11-02 ENCOUNTER — Ambulatory Visit (INDEPENDENT_AMBULATORY_CARE_PROVIDER_SITE_OTHER): Payer: Medicare Other | Admitting: Family Medicine

## 2015-11-02 ENCOUNTER — Encounter: Payer: Self-pay | Admitting: Family Medicine

## 2015-11-02 VITALS — BP 132/74 | HR 64 | Ht 63.5 in | Wt 180.0 lb

## 2015-11-02 DIAGNOSIS — M25511 Pain in right shoulder: Secondary | ICD-10-CM | POA: Diagnosis not present

## 2015-11-02 DIAGNOSIS — M545 Low back pain: Secondary | ICD-10-CM

## 2015-11-02 NOTE — Patient Instructions (Signed)
Take 2 Aleve twice per day for the next 10 days and that see what that'll do for your shoulder and her back

## 2015-11-02 NOTE — Progress Notes (Signed)
   Subjective:    Patient ID: Anna Acevedo, female    DOB: [birth year], 81 y.o.   MRN: [id number]  HPI She is here for 2 problems. She has a one-year history of intermittent right shoulder pain. When it does bother her it is with physical activity. It is not associated with anything in particular. No numbness, tingling or weakness. No history of injury. She also complains of a four-day history of left-sided low back pain with radiation down her leg. No numbness, weakness, tingling, history of falls. No other bony structures are involved.   Review of Systems     Objective:   Physical Exam Alert and in no distress. Full motion of the shoulder. Slight tenderness palpation over the bicipital groove. Negative drop arm test. Supraspinatus testing negative. Negative sulcus test. Rotator cuff otherwise test was negative. Back exam shows normal lumbar motion in lumbar curve. Slight tenderness over lower sacral area but not over the SI joints. Negative straight leg raising with normal DTRs. Normal hip motion.       Assessment & Plan:  Midline low back pain, with sciatica presence unspecified  Pain in joint of right shoulder I reassured her that I did not find anything significant going on. Recommend anti-inflammatory of choice and if symptoms do not go away, come back for further evaluation.

## 2016-02-11 ENCOUNTER — Other Ambulatory Visit: Payer: Self-pay | Admitting: Family Medicine

## 2016-04-22 ENCOUNTER — Other Ambulatory Visit: Payer: Self-pay

## 2016-04-22 MED ORDER — NONFORMULARY OR COMPOUNDED ITEM: 0 refills | Status: DC

## 2016-07-22 ENCOUNTER — Other Ambulatory Visit: Payer: Self-pay

## 2016-08-04 ENCOUNTER — Ambulatory Visit: Payer: Medicare Other | Admitting: Family Medicine

## 2016-08-08 ENCOUNTER — Other Ambulatory Visit: Payer: Self-pay

## 2016-08-08 MED ORDER — LOSARTAN POTASSIUM-HCTZ 50-12.5 MG PO TABS: 0.5000 | ORAL_TABLET | Freq: Every day | ORAL | 0 refills | Status: DC

## 2016-08-08 NOTE — Telephone Encounter (Signed)
Rx(s) sent to pharmacy electronically.  

## 2016-08-09 ENCOUNTER — Other Ambulatory Visit: Payer: Self-pay

## 2016-08-09 MED ORDER — NONFORMULARY OR COMPOUNDED ITEM: 5 refills | Status: DC

## 2016-08-09 NOTE — Telephone Encounter (Signed)
Rx called in with no refills. Patient is scheduled for yearly on 08/29/2016.

## 2016-08-23 DIAGNOSIS — H35033 Hypertensive retinopathy, bilateral: Secondary | ICD-10-CM | POA: Diagnosis not present

## 2016-08-23 DIAGNOSIS — D2311 Other benign neoplasm of skin of right eyelid, including canthus: Secondary | ICD-10-CM | POA: Diagnosis not present

## 2016-08-23 DIAGNOSIS — H04123 Dry eye syndrome of bilateral lacrimal glands: Secondary | ICD-10-CM | POA: Diagnosis not present

## 2016-08-23 DIAGNOSIS — H5211 Myopia, right eye: Secondary | ICD-10-CM | POA: Diagnosis not present

## 2016-08-29 ENCOUNTER — Ambulatory Visit (INDEPENDENT_AMBULATORY_CARE_PROVIDER_SITE_OTHER): Payer: Medicare Other | Admitting: Gynecology

## 2016-08-29 ENCOUNTER — Encounter: Payer: Self-pay | Admitting: Gynecology

## 2016-08-29 VITALS — BP 128/80 | Ht 63.0 in | Wt 179.0 lb

## 2016-08-29 DIAGNOSIS — E559 Vitamin D deficiency, unspecified: Secondary | ICD-10-CM

## 2016-08-29 DIAGNOSIS — Z01411 Encounter for gynecological examination (general) (routine) with abnormal findings: Secondary | ICD-10-CM

## 2016-08-29 DIAGNOSIS — M8580 Other specified disorders of bone density and structure, unspecified site: Secondary | ICD-10-CM | POA: Diagnosis not present

## 2016-08-29 MED ORDER — NONFORMULARY OR COMPOUNDED ITEM: 5 refills | Status: DC

## 2016-08-29 MED ORDER — ALENDRONATE SODIUM 70 MG PO TABS: 70.0000 mg | ORAL_TABLET | ORAL | 11 refills | Status: DC

## 2016-08-29 NOTE — Patient Instructions (Signed)
Alendronate tablets What is this medicine? ALENDRONATE (a LEN droe nate) slows calcium loss from bones. It helps to make normal healthy bone and to slow bone loss in people with Paget's disease and osteoporosis. It may be used in others at risk for bone loss. This medicine may be used for other purposes; ask your health care provider or pharmacist if you have questions. COMMON BRAND NAME(S): Fosamax What should I tell my health care provider before I take this medicine? They need to know if you have any of these conditions: -dental disease -esophagus, stomach, or intestine problems, like acid reflux or GERD -kidney disease -low blood calcium -low vitamin D -problems sitting or standing 30 minutes -trouble swallowing -an unusual or allergic reaction to alendronate, other medicines, foods, dyes, or preservatives -pregnant or trying to get pregnant -breast-feeding How should I use this medicine? You must take this medicine exactly as directed or you will lower the amount of the medicine you absorb into your body or you may cause yourself harm. Take this medicine by mouth first thing in the morning, after you are up for the day. Do not eat or drink anything before you take your medicine. Swallow the tablet with a full glass (6 to 8 fluid ounces) of plain water. Do not take this medicine with any other drink. Do not chew or crush the tablet. After taking this medicine, do not eat breakfast, drink, or take any medicines or vitamins for at least 30 minutes. Sit or stand up for at least 30 minutes after you take this medicine; do not lie down. Do not take your medicine more often than directed. Talk to your pediatrician regarding the use of this medicine in children. Special care may be needed. Overdosage: If you think you have taken too much of this medicine contact a poison control center or emergency room at once. NOTE: This medicine is only for you. Do not share this medicine with others. What if I  miss a dose? If you miss a dose, do not take it later in the day. Continue your normal schedule starting the next morning. Do not take double or extra doses. What may interact with this medicine? -aluminum hydroxide -antacids -aspirin -calcium supplements -drugs for inflammation like ibuprofen, naproxen, and others -iron supplements -magnesium supplements -vitamins with minerals This list may not describe all possible interactions. Give your health care provider a list of all the medicines, herbs, non-prescription drugs, or dietary supplements you use. Also tell them if you smoke, drink alcohol, or use illegal drugs. Some items may interact with your medicine. What should I watch for while using this medicine? Visit your doctor or health care professional for regular checks ups. It may be some time before you see benefit from this medicine. Do not stop taking your medicine except on your doctor's advice. Your doctor or health care professional may order blood tests and other tests to see how you are doing. You should make sure you get enough calcium and vitamin D while you are taking this medicine, unless your doctor tells you not to. Discuss the foods you eat and the vitamins you take with your health care professional. Some people who take this medicine have severe bone, joint, and/or muscle pain. This medicine may also increase your risk for a broken thigh bone. Tell your doctor right away if you have pain in your upper leg or groin. Tell your doctor if you have any pain that does not go away or that gets worse.  This medicine can make you more sensitive to the sun. If you get a rash while taking this medicine, sunlight may cause the rash to get worse. Keep out of the sun. If you cannot avoid being in the sun, wear protective clothing and use sunscreen. Do not use sun lamps or tanning beds/booths. What side effects may I notice from receiving this medicine? Side effects that you should report to  your doctor or health care professional as soon as possible: -allergic reactions like skin rash, itching or hives, swelling of the face, lips, or tongue -black or tarry stools -bone, muscle or joint pain -changes in vision -chest pain -heartburn or stomach pain -jaw pain, especially after dental work -pain or trouble when swallowing -redness, blistering, peeling or loosening of the skin, including inside the mouth Side effects that usually do not require medical attention (report to your doctor or health care professional if they continue or are bothersome): -changes in taste -diarrhea or constipation -eye pain or itching -headache -nausea or vomiting -stomach gas or fullness This list may not describe all possible side effects. Call your doctor for medical advice about side effects. You may report side effects to FDA at 1-800-FDA-1088. Where should I keep my medicine? Keep out of the reach of children. Store at room temperature of 15 and 30 degrees C (59 and 86 degrees F). Throw away any unused medicine after the expiration date. NOTE: This sheet is a summary. It may not cover all possible information. If you have questions about this medicine, talk to your doctor, pharmacist, or health care provider.  2017 Elsevier/Gold Standard (2010-12-31 08:56:09)

## 2016-08-29 NOTE — Progress Notes (Signed)
Anna Acevedo 06-Sep-1934 [id number]   History:    82 y.o.  for annual gyn exam   Pwho was last seen in the office in 2016 at which time she had her bone density study. Her bone density at that time had demonstrated that her lowest T score was of the AP spine with a value of -2.0 left femoral neck -1.3 but her 10 year fracture risk assessment indicated she's had a high-risk for hip fracture of a 5.1% exceeding the 3% threshold cut off. She had a normal calcium vitamin D and PTH level at that time. She had does have history in the past vitamin D deficiency. She did not return back for consultation and initiating some form of antiresorptive agent because of her high-risk for fracture.  Because of patient's vaginal dryness and irritation she has been using estradiol vaginal cream insert twice a week and reports no vaginal bleeding. Patient had a normal colonoscopy in 2009. Her PCP is been doing her blood work. She declined the flu vaccine today but her shingles and Pneumovax vaccine are up-to-date.    ast medical history,surgical history, family history and social history were all reviewed and documented in the EPIC chart.  Gynecologic History No LMP recorded. Patient is postmenopausal. Contraception: post menopausal status Last Pap: Several years ago. Results were: normal Last mammogram: 2014. Results were: normal  Obstetric History OB History  Gravida Para Term Preterm AB Living  2 2 2     2   SAB TAB Ectopic Multiple Live Births          2    # Outcome Date GA Lbr Len/2nd Weight Sex Delivery Anes PTL Lv  2 Term     F Vag-Spont  N LIV  1 Term     M Vag-Spont  N LIV       ROS: A ROS was performed and pertinent positives and negatives are included in the history.  GENERAL: No fevers or chills. HEENT: No change in vision, no earache, sore throat or sinus congestion. NECK: No pain or stiffness. CARDIOVASCULAR: No chest pain or pressure. No palpitations. PULMONARY: No shortness of  breath, cough or wheeze. GASTROINTESTINAL: No abdominal pain, nausea, vomiting or diarrhea, melena or bright red blood per rectum. GENITOURINARY: No urinary frequency, urgency, hesitancy or dysuria. MUSCULOSKELETAL: No joint or muscle pain, no back pain, no recent trauma. DERMATOLOGIC: No rash, no itching, no lesions. ENDOCRINE: No polyuria, polydipsia, no heat or cold intolerance. No recent change in weight. HEMATOLOGICAL: No anemia or easy bruising or bleeding. NEUROLOGIC: No headache, seizures, numbness, tingling or weakness. PSYCHIATRIC: No depression, no loss of interest in normal activity or change in sleep pattern.     Exam: chaperone present  BP 128/80   Ht 5\' 3"  (1.6 m)   Wt 179 lb (81.2 kg)   BMI 31.71 kg/m   Body mass index is 31.71 kg/m.  General appearance : Well developed well nourished female. No acute distress HEENT: Eyes: no retinal hemorrhage or exudates,  Neck supple, trachea midline, no carotid bruits, no thyroidmegaly Lungs: Clear to auscultation, no rhonchi or wheezes, or rib retractions  Heart: Regular rate and rhythm, no murmurs or gallops Breast:Examined in sitting and supine position were symmetrical in appearance, no palpable masses or tenderness,  no skin retraction, no nipple inversion, no nipple discharge, no skin discoloration, no axillary or supraclavicular lymphadenopathy Abdomen: no palpable masses or tenderness, no rebound or guarding Extremities: no edema or skin discoloration or tenderness  Pelvic:  Bartholin, Urethra, Skene Glands: Within normal limits             Vagina: No gross lesions or discharge, atrophic changes  Cervix: No gross lesions or discharge  Uterus  anteverted, normal size, shape and consistency, non-tender and mobile  Adnexa  Without masses or tenderness  Anus and perineum  normal   Rectovaginal  normal sphincter tone without palpated masses or tenderness             Hemoccult PCP provides     Assessment/Plan:  82 y.o. female  for annual exam with past history vitamin D deficiency for this reason we are going to check her vitamin D level. We spent approximate 15 minutes discussing her past bone density study indicating her osteopenia but her high risk for fracture of the hip based on 10 year Frax analysis. We discussed in detail different treatment modalities as well as risks benefits and pros and cons. She is going to be started on oral bisphosphonate Fosamax 70 mg every weekly:  The risk and benefits of oral bisphosphonate therapy were conveyed to the patient in today's visit. Benefits include a significant risk reduction of vertebral, hip and non vertebral fractures. We also discussed potential adverse effects to include precipitation or aggravation of GERD reflux disease as well as the risk of upper GI bleeding although this is reported in the literature to be extremely rare. Other rare adverse effects that were discussed of patient taking oral bisphosphonate included a 1 in 10,000-10,000 risk for osteonecrosis of the jaw, and a risk for atypical femoral fractures of approximately 1 in 5000-10,000. Signs and symptoms of atypical femoral fracture were also discussed and the patient was informed to contact our office if any unusual symptoms were to develop.  Pap smear no longer needed based on her age group. We discussed the calcium vitamin D that she should be taking daily along with weightbearing exercises several times a week.   Terrance Mass MD, 11:07 AM 08/29/2016

## 2016-08-30 ENCOUNTER — Other Ambulatory Visit: Payer: Self-pay | Admitting: Gynecology

## 2016-08-30 ENCOUNTER — Encounter: Payer: Self-pay | Admitting: Gynecology

## 2016-08-30 DIAGNOSIS — E559 Vitamin D deficiency, unspecified: Secondary | ICD-10-CM

## 2016-08-30 LAB — VITAMIN D 25 HYDROXY (VIT D DEFICIENCY, FRACTURES): VIT D 25 HYDROXY: 19 ng/mL — AB (ref 30–100)

## 2016-08-30 MED ORDER — VITAMIN D (ERGOCALCIFEROL) 1.25 MG (50000 UNIT) PO CAPS: 50000.0000 [IU] | ORAL_CAPSULE | ORAL | 0 refills | Status: DC

## 2016-09-06 ENCOUNTER — Ambulatory Visit (INDEPENDENT_AMBULATORY_CARE_PROVIDER_SITE_OTHER): Payer: Medicare Other | Admitting: Internal Medicine

## 2016-09-06 ENCOUNTER — Encounter: Payer: Self-pay | Admitting: Internal Medicine

## 2016-09-06 VITALS — BP 134/78 | HR 63 | Ht 63.0 in | Wt 180.6 lb

## 2016-09-06 DIAGNOSIS — I10 Essential (primary) hypertension: Secondary | ICD-10-CM

## 2016-09-06 DIAGNOSIS — E785 Hyperlipidemia, unspecified: Secondary | ICD-10-CM

## 2016-09-06 DIAGNOSIS — R002 Palpitations: Secondary | ICD-10-CM

## 2016-09-06 MED ORDER — LOSARTAN POTASSIUM-HCTZ 50-12.5 MG PO TABS: 0.5000 | ORAL_TABLET | Freq: Every day | ORAL | 3 refills | Status: DC

## 2016-09-06 NOTE — Progress Notes (Signed)
OFFICE NOTE  Chief Complaint:  No complaints  Primary Care Physician: Wyatt Haste, MD  HPI:  Anna Acevedo is a 82 y.o. female who is a former patient of Dr. Mare Ferrari. She has a history of hypertension, dyslipidemia, obesity and palpitations in the past. She initially saw Dr. Mare Ferrari 2007 and underwent stress testing at that time. Workup was negative and he instituted medical therapy. Since then she's done very well. He sees are basically annually until his retirement recently. Over the past year she has no new complaints. She denies shortness of breath or chest pain. She has no active palpitations. EKG shows sinus rhythm at 63 today. Blood pressure initially was elevated 162/80 however came and 134/78 at the end of the visit. Reports good sleep at night and energy level is reasonable during the day. She does have a follow-up with her primary care provider in a few weeks at which time she'll have routine blood work including a lipid profile.  PMHx:  Past Medical History:  Diagnosis Date  . Colon polyp   . Diverticulosis   . Dyspnea   . GERD (gastroesophageal reflux disease)   . Hypercholesterolemia   . Hypertension    Essential  . Obesity   . Osteoporosis    OSTEOPENIA  . Palpitations   . Vitamin D deficiency     Past Surgical History:  Procedure Laterality Date  . CARDIOVASCULAR STRESS TEST  03-30-2006   EF 72%. Showed no evidence of ischemia   . COLONOSCOPY     gets every 5 years  . EYE SURGERY Bilateral 2014   Cataracts  . US ECHOCARDIOGRAPHY  September 2007   EF 55-60%. Showing  impaired diastolic relaxation and mild mitral regurgitation    FAMHx:  Family History  Problem Relation Age of Onset  . Hypertension Mother     SOCHx:   reports that she has never smoked. She has never used smokeless tobacco. She reports that she does not drink alcohol or use drugs.  ALLERGIES:  Allergies  Allergen Reactions  . Lisinopril     cough  . Penicillins  Hives and Swelling  . Zocor [Simvastatin]     Muscle Pain  . Amoxicillin Rash  . Cephalexin Rash    ROS: Pertinent items noted in HPI and remainder of comprehensive ROS otherwise negative.  HOME MEDS: Current Outpatient Prescriptions on File Prior to Visit  Medication Sig Dispense Refill  . acetaminophen (TYLENOL) 500 MG tablet Take 500 mg by mouth every 6 (six) hours as needed for mild pain.     Marland Kitchen aspirin 81 MG tablet Take 81 mg by mouth daily.      Marland Kitchen CALCIUM PO Take 1 tablet by mouth daily. 600 plus d    . losartan-hydrochlorothiazide (HYZAAR) 50-12.5 MG tablet Take 0.5 tablets by mouth daily. MUST KEEP APPOINTMENT 09/06/16 WITH DR Driana Dazey FOR FUTURE REFILLS 45 tablet 0  . metoprolol (LOPRESSOR) 50 MG tablet Take 1 tablet (50 mg total) by mouth 2 (two) times daily. 180 tablet 3  . Multiple Vitamin (MULTIVITAMIN PO) Take 1 tablet by mouth daily. Reported on 11/02/2015    . NONFORMULARY OR COMPOUNDED ITEM Estradiol Vag Cream S: insert 1 ml into vagina twice weekly. 24 each 5  . pravastatin (PRAVACHOL) 40 MG tablet Take 1 tablet (40 mg total) by mouth daily. 90 tablet 3  . Vitamin D, Ergocalciferol, (DRISDOL) 50000 units CAPS capsule Take 1 capsule (50,000 Units total) by mouth every 7 (seven) days. 12 capsule 0  No current facility-administered medications on file prior to visit.     LABS/IMAGING: No results found for this or any previous visit (from the past 48 hour(s)). No results found.  WEIGHTS: Wt Readings from Last 3 Encounters:  09/06/16 180 lb 9.6 oz (81.9 kg)  08/29/16 179 lb (81.2 kg)  11/02/15 180 lb (81.6 kg)    VITALS: BP (!) 162/80   Pulse 63   Ht 5\' 3"  (1.6 m)   Wt 180 lb 9.6 oz (81.9 kg)   BMI 31.99 kg/m   EXAM: General appearance: alert and no distress Neck: no carotid bruit and no JVD Lungs: clear to auscultation bilaterally Heart: regular rate and rhythm Abdomen: soft, non-tender; bowel sounds normal; no masses,  no organomegaly Extremities:  extremities normal, atraumatic, no cyanosis or edema Pulses: 2+ and symmetric Skin: Skin color, texture, turgor normal. No rashes or lesions Neurologic: Grossly normal Psych: Pleasant  EKG: Sinus rhythm at 63, nonspecific T wave changes  ASSESSMENT: 1. Hypertension-controlled 2. Dyslipidemia on pravastatin 3. Obesity 4. History of palpitations-resolved  PLAN: 1.   Mrs. Jolliffe is doing well without any new complaints over the past year. Blood pressure is initially elevated however came down to normal levels. We'll refill her medications today. She has a follow-up with her primary care provider in 2 weeks and will likely have a lipid profile that time. I'll follow up on those results. Otherwise no changes her medicines today. Follow-up annually or sooner as necessary.  Pixie Casino, MD, [facility] Attending Cardiologist [place] [facility] Shukri Nistler 09/06/2016, 10:11 AM

## 2016-09-06 NOTE — Patient Instructions (Addendum)
Your physician wants you to follow-up in: ONE YEAR with Dr. Debara Pickett. You will receive a reminder letter in the mail two months in advance. If you don't receive a letter, please call our office to schedule the follow-up appointment. -- you should receive this notification via [place] of BP was 134/78  Your physician recommends that you continue on your current medications as directed. Please refer to the Current Medication list given to you today.

## 2016-10-03 ENCOUNTER — Telehealth: Payer: Self-pay

## 2016-10-03 ENCOUNTER — Ambulatory Visit (INDEPENDENT_AMBULATORY_CARE_PROVIDER_SITE_OTHER): Payer: Medicare Other | Admitting: Family Medicine

## 2016-10-03 ENCOUNTER — Other Ambulatory Visit: Payer: Self-pay | Admitting: Family Medicine

## 2016-10-03 ENCOUNTER — Encounter: Payer: Self-pay | Admitting: Family Medicine

## 2016-10-03 VITALS — BP 110/60 | HR 54 | Resp 18 | Ht 63.75 in | Wt 179.2 lb

## 2016-10-03 DIAGNOSIS — M150 Primary generalized (osteo)arthritis: Secondary | ICD-10-CM

## 2016-10-03 DIAGNOSIS — I119 Hypertensive heart disease without heart failure: Secondary | ICD-10-CM | POA: Diagnosis not present

## 2016-10-03 DIAGNOSIS — M159 Polyosteoarthritis, unspecified: Secondary | ICD-10-CM

## 2016-10-03 DIAGNOSIS — M8580 Other specified disorders of bone density and structure, unspecified site: Secondary | ICD-10-CM

## 2016-10-03 DIAGNOSIS — E041 Nontoxic single thyroid nodule: Secondary | ICD-10-CM

## 2016-10-03 DIAGNOSIS — R002 Palpitations: Secondary | ICD-10-CM

## 2016-10-03 DIAGNOSIS — E785 Hyperlipidemia, unspecified: Secondary | ICD-10-CM | POA: Diagnosis not present

## 2016-10-03 LAB — CBC WITH DIFFERENTIAL/PLATELET
BASOS ABS: 86 {cells}/uL (ref 0–200)
Basophils Relative: 1 %
EOS ABS: 602 {cells}/uL — AB (ref 15–500)
Eosinophils Relative: 7 %
HCT: 43.3 % (ref 35.0–45.0)
HEMOGLOBIN: 14.1 g/dL (ref 11.7–15.5)
LYMPHS PCT: 20 %
Lymphs Abs: 1720 cells/uL (ref 850–3900)
MCH: 27 pg (ref 27.0–33.0)
MCHC: 32.6 g/dL (ref 32.0–36.0)
MCV: 83 fL (ref 80.0–100.0)
MONO ABS: 774 {cells}/uL (ref 200–950)
MPV: 9.6 fL (ref 7.5–12.5)
Monocytes Relative: 9 %
NEUTROS PCT: 63 %
Neutro Abs: 5418 cells/uL (ref 1500–7800)
PLATELETS: 226 10*3/uL (ref 140–400)
RBC: 5.22 MIL/uL — ABNORMAL HIGH (ref 3.80–5.10)
RDW: 15.3 % — ABNORMAL HIGH (ref 11.0–15.0)
WBC: 8.6 10*3/uL (ref 4.0–10.5)

## 2016-10-03 LAB — COMPREHENSIVE METABOLIC PANEL
ALBUMIN: 4.2 g/dL (ref 3.6–5.1)
ALK PHOS: 54 U/L (ref 33–130)
ALT: 14 U/L (ref 6–29)
AST: 15 U/L (ref 10–35)
BUN: 12 mg/dL (ref 7–25)
CHLORIDE: 104 mmol/L (ref 98–110)
CO2: 29 mmol/L (ref 20–31)
Calcium: 9.6 mg/dL (ref 8.6–10.4)
Creat: 0.85 mg/dL (ref 0.60–0.93)
Glucose, Bld: 94 mg/dL (ref 65–99)
Potassium: 4.5 mmol/L (ref 3.5–5.3)
Sodium: 142 mmol/L (ref 135–146)
TOTAL PROTEIN: 6.8 g/dL (ref 6.1–8.1)
Total Bilirubin: 0.5 mg/dL (ref 0.2–1.2)

## 2016-10-03 LAB — LIPID PANEL
CHOL/HDL RATIO: 4.9 ratio (ref <5.0)
CHOLESTEROL: 178 mg/dL (ref <200)
HDL: 36 mg/dL — ABNORMAL LOW (ref >50)
LDL Cholesterol: 104 mg/dL — ABNORMAL HIGH (ref <100)
TRIGLYCERIDES: 189 mg/dL — AB (ref <150)
VLDL: 38 mg/dL — ABNORMAL HIGH (ref <30)

## 2016-10-03 LAB — TSH: TSH: 5.69 mIU/L — ABNORMAL HIGH

## 2016-10-03 MED ORDER — LOSARTAN POTASSIUM-HCTZ 50-12.5 MG PO TABS: 0.5000 | ORAL_TABLET | Freq: Every day | ORAL | 3 refills | Status: DC

## 2016-10-03 MED ORDER — PRAVASTATIN SODIUM 40 MG PO TABS: 40.0000 mg | ORAL_TABLET | Freq: Every day | ORAL | 3 refills | Status: DC

## 2016-10-03 MED ORDER — METOPROLOL TARTRATE 50 MG PO TABS: 50.0000 mg | ORAL_TABLET | Freq: Two times a day (BID) | ORAL | 3 refills | Status: DC

## 2016-10-03 NOTE — Progress Notes (Signed)
Anna Acevedo is a 82 y.o. female who presents for annual wellness visit and follow-up on chronic medical conditions.  She has the following concerns: She was told by her dentist to have her thyroid checked. She does not complain or hot or cold intolerance, skin or hair changes. She has gained some weight but indicates she is not been exercising regularly. No GI or neurologic symptoms. She also recently saw her gynecologist and did have a vitamin D level of 19. She is now on 50,000 units weekly for the next 12 weeks and will plan to get that checked. She sees cardiology once a year. Does have a previous history of difficulty with palpitations but none recently. She does not complain of any arthritis symptoms. She also has a history of hyperlipidemia.  Immunizations and Health Maintenance Immunization History  Administered Date(s) Administered  . DTaP 04/27/2004  . Influenza Whole 06/03/2011  . Pneumococcal Conjugate-13 05/23/2007  . Pneumococcal Polysaccharide-23 02/11/2013  . Tdap 07/19/2007  . Zoster 07/15/2008   Health Maintenance Due  Topic Date Due  . INFLUENZA VACCINE  02/16/2016   Last Pap smear: not needed per pt.  Last mammogram: 2 years ago.  Last colonoscopy: 5- 6 years ago - Dr. Lenard Simmer Last DEXA: 12/09/14 Dentist: Loma Newton- dentist. (recommend thyroid check) Ophtho: Dr. Bing Plume Exercise: Does not exercise  Other doctors caring for patient include:  Dr. Toney Rakes, Dr. Hilty(heart doctor)  Advanced directives: living will. Appendectomy ask for.  both information was reviewed by me.  Depression screen:  See questionnaire below.  Depression screen [facility] 2/9 10/03/2016 09/19/2011  Decreased Interest 0 0  Down, Depressed, Hopeless 0 0  PHQ - 2 Score 0 0    Fall Risk Screen: see questionnaire below. Fall Risk  10/03/2016  Falls in the past year? No    ADL screen:  See questionnaire below Functional Status Survey: Is the patient deaf or have difficulty hearing?: No Does the  patient have difficulty seeing, even when wearing glasses/contacts?: No Does the patient have difficulty concentrating, remembering, or making decisions?: No Does the patient have difficulty walking or climbing stairs?: No Does the patient have difficulty dressing or bathing?: No Does the patient have difficulty doing errands alone such as visiting a doctor's office or shopping?: No   Review of Systems He did have except as above  PHYSICAL EXAM:   General Appearance: Alert, cooperative, no distress, appears stated age Head: Normocephalic, without obvious abnormality, atraumatic Eyes: PERRL, conjunctiva/corneas clear, EOM's intact, fundi benign Ears: Normal TM's and external ear canals Nose: Nares normal, mucosa normal, no drainage or sinus tenderness Throat: Lips, mucosa, and tongue normal; teeth and gums normal Neck: Supple, no lymphadenopathy;  thyroid:  Nodule palpable on the right. Lungs: Clear to auscultation bilaterally without wheezes, rales or ronchi; respirations unlabored Heart: Regular rate and rhythm, S1 and S2 normal, no murmur, rubor gallop Abdomen: Soft, non-tender, nondistended, normoactive bowel sounds,  no masses, no hepatosplenomegaly Extremities: No clubbing, cyanosis or edema Pulses: 2+ and symmetric all extremities Skin:  Skin color, texture, turgor normal, no rashes or lesions Lymph nodes: Cervical, supraclavicular, and axillary nodes normal Neurologic:  CNII-XII intact, normal strength, sensation and gait; reflexes 2+ and symmetric throughout Psych: Normal mood, affect, hygiene and grooming.  ASSESSMENT/PLAN: Thyroid nodule  Hypercholesterolemia  Osteopenia, unspecified location  Hyperlipidemia, unspecified hyperlipidemia type  Palpitations  Primary osteoarthritis involving multiple joints  Benign hypertensive heart disease without heart failure She is stable on her present medications and therefore no further intervention needed  except to follow-up  on her thyroid. She will also follow-up with her gynecologist concerning the vitamin D level. at least 30 minutes of aerobic activity at least 5 days/week and weight-bearing exercise 2x/week;  healthy diet, including goals of calcium and vitamin D intake changing batteries in smoke detectors.  Immunization recommendations discussed.  Colonoscopy recommendations reviewed   Medicare Attestation I have personally reviewed: The patient's medical and social history Their use of alcohol, tobacco or illicit drugs Their current medications and supplements The patient's functional ability including ADLs,fall risks, home safety risks, cognitive, and hearing and visual impairment Diet and physical activities Evidence for depression or mood disorders  The patient's weight, height, and BMI have been recorded in the chart.  I have made referrals, counseling, and provided education to the patient based on review of the above and I have provided the patient with a written personalized care plan for preventive services.     Wyatt Haste, MD   10/03/2016

## 2016-10-03 NOTE — Telephone Encounter (Signed)
LMTCB

## 2016-10-03 NOTE — Telephone Encounter (Signed)
Pt scheduled for thyroid ultrasound on 10/05/16 @ 2:55PM [address].

## 2016-10-03 NOTE — Telephone Encounter (Signed)
Pt aware of appt.  [facility].  Victorino December

## 2016-10-04 LAB — T4: T4 TOTAL: 9 ug/dL (ref 4.5–12.0)

## 2016-10-05 ENCOUNTER — Ambulatory Visit
Admission: RE | Admit: 2016-10-05 | Discharge: 2016-10-05 | Disposition: A | Discharge status: Home / Self Care | Payer: Medicare Other | Source: Ambulatory Visit | Attending: Family Medicine | Admitting: Family Medicine

## 2016-10-05 DIAGNOSIS — E041 Nontoxic single thyroid nodule: Secondary | ICD-10-CM | POA: Diagnosis not present

## 2016-10-05 LAB — T3: T3 TOTAL: 158 ng/dL (ref 76–181)

## 2016-10-06 ENCOUNTER — Other Ambulatory Visit: Payer: Self-pay

## 2016-10-06 DIAGNOSIS — E041 Nontoxic single thyroid nodule: Secondary | ICD-10-CM

## 2016-10-11 ENCOUNTER — Other Ambulatory Visit (HOSPITAL_COMMUNITY)
Admission: RE | Admit: 2016-10-11 | Discharge: 2016-10-11 | Disposition: A | Discharge status: Home / Self Care | Payer: Medicare Other | Source: Ambulatory Visit | Attending: Radiology | Admitting: Radiology

## 2016-10-11 ENCOUNTER — Ambulatory Visit
Admission: RE | Admit: 2016-10-11 | Discharge: 2016-10-11 | Disposition: A | Discharge status: Home / Self Care | Payer: Medicare Other | Source: Ambulatory Visit | Attending: Family Medicine | Admitting: Family Medicine

## 2016-10-11 DIAGNOSIS — D34 Benign neoplasm of thyroid gland: Secondary | ICD-10-CM | POA: Insufficient documentation

## 2016-10-11 DIAGNOSIS — E041 Nontoxic single thyroid nodule: Secondary | ICD-10-CM | POA: Diagnosis not present

## 2016-11-23 ENCOUNTER — Telehealth: Payer: Self-pay | Admitting: Internal Medicine

## 2016-11-23 NOTE — Telephone Encounter (Signed)
Close encounter 

## 2016-11-24 ENCOUNTER — Other Ambulatory Visit: Payer: Self-pay | Admitting: *Deleted

## 2016-11-24 DIAGNOSIS — I119 Hypertensive heart disease without heart failure: Secondary | ICD-10-CM

## 2016-11-24 MED ORDER — METOPROLOL TARTRATE 50 MG PO TABS
50.0000 mg | ORAL_TABLET | Freq: Two times a day (BID) | ORAL | 3 refills | Status: DC
Start: 2016-11-24 — End: 2018-02-01

## 2016-11-25 ENCOUNTER — Other Ambulatory Visit: Payer: Self-pay | Admitting: Gynecology

## 2016-11-30 ENCOUNTER — Encounter: Payer: Self-pay | Admitting: Gynecology

## 2016-12-05 ENCOUNTER — Other Ambulatory Visit: Payer: Self-pay

## 2017-08-16 ENCOUNTER — Ambulatory Visit (INDEPENDENT_AMBULATORY_CARE_PROVIDER_SITE_OTHER): Payer: Medicare Other | Admitting: Family Medicine

## 2017-08-16 ENCOUNTER — Ambulatory Visit
Admission: RE | Admit: 2017-08-16 | Discharge: 2017-08-16 | Disposition: A | Discharge status: Home / Self Care | Payer: Medicare Other | Source: Ambulatory Visit | Attending: Family Medicine | Admitting: Family Medicine

## 2017-08-16 ENCOUNTER — Encounter: Payer: Self-pay | Admitting: Family Medicine

## 2017-08-16 VITALS — BP 162/80 | HR 55 | Resp 18 | Wt 185.0 lb

## 2017-08-16 DIAGNOSIS — M1611 Unilateral primary osteoarthritis, right hip: Secondary | ICD-10-CM | POA: Diagnosis not present

## 2017-08-16 DIAGNOSIS — M25551 Pain in right hip: Secondary | ICD-10-CM

## 2017-08-16 NOTE — Progress Notes (Signed)
   Subjective:    Patient ID: Anna Acevedo, female    DOB: 1935/04/04, 83 y.o.   MRN: [id number]  HPI She complains of a one-week history of right-sided hip pain with occasional radiation down her leg.  The pain is made worse with physical activity.  No other joints are involved.  She has tried small amounts of Tylenol as well as Aleve with minimal relief of her symptoms.  No numbness, tingling or weakness.   Review of Systems     Objective:   Physical Exam Alert and in no distress.  She does have some slight tenderness palpation over the right SI joint area.  No pain over the greater trochanter.  Hip motion shows limitation of internal rotation with slight discomfort on the extremes of internal as well as external rotation.  Hip flexion was fairly normal.       Assessment & Plan:  Right hip pain - Plan: DG HIP UNILAT WITH PELVIS 2-3 VIEWS RIGHT I think most of her trouble is in her hip but the discomfort over the SI joint also may need to be looked at more thoroughly.

## 2017-08-30 ENCOUNTER — Ambulatory Visit: Payer: Medicare Other | Admitting: Internal Medicine

## 2017-08-31 DIAGNOSIS — H524 Presbyopia: Secondary | ICD-10-CM | POA: Diagnosis not present

## 2017-08-31 DIAGNOSIS — D23111 Other benign neoplasm of skin of right upper eyelid, including canthus: Secondary | ICD-10-CM | POA: Diagnosis not present

## 2017-08-31 DIAGNOSIS — H35033 Hypertensive retinopathy, bilateral: Secondary | ICD-10-CM | POA: Diagnosis not present

## 2017-08-31 DIAGNOSIS — H04123 Dry eye syndrome of bilateral lacrimal glands: Secondary | ICD-10-CM | POA: Diagnosis not present

## 2017-10-03 ENCOUNTER — Encounter (HOSPITAL_BASED_OUTPATIENT_CLINIC_OR_DEPARTMENT_OTHER): Payer: Self-pay

## 2017-10-03 ENCOUNTER — Other Ambulatory Visit: Payer: Self-pay

## 2017-10-03 ENCOUNTER — Emergency Department (HOSPITAL_BASED_OUTPATIENT_CLINIC_OR_DEPARTMENT_OTHER)
Admission: EM | Admit: 2017-10-03 | Discharge: 2017-10-04 | Disposition: A | Discharge status: Home / Self Care | Payer: Medicare Other | Attending: Emergency Medicine | Admitting: Emergency Medicine

## 2017-10-03 DIAGNOSIS — Z23 Encounter for immunization: Secondary | ICD-10-CM | POA: Insufficient documentation

## 2017-10-03 DIAGNOSIS — Y939 Activity, unspecified: Secondary | ICD-10-CM | POA: Diagnosis not present

## 2017-10-03 DIAGNOSIS — S61412A Laceration without foreign body of left hand, initial encounter: Secondary | ICD-10-CM | POA: Insufficient documentation

## 2017-10-03 DIAGNOSIS — S6982XA Other specified injuries of left wrist, hand and finger(s), initial encounter: Secondary | ICD-10-CM | POA: Diagnosis present

## 2017-10-03 DIAGNOSIS — Y999 Unspecified external cause status: Secondary | ICD-10-CM | POA: Insufficient documentation

## 2017-10-03 DIAGNOSIS — Z79899 Other long term (current) drug therapy: Secondary | ICD-10-CM | POA: Insufficient documentation

## 2017-10-03 DIAGNOSIS — W268XXA Contact with other sharp object(s), not elsewhere classified, initial encounter: Secondary | ICD-10-CM | POA: Insufficient documentation

## 2017-10-03 DIAGNOSIS — I10 Essential (primary) hypertension: Secondary | ICD-10-CM | POA: Diagnosis not present

## 2017-10-03 DIAGNOSIS — Z7982 Long term (current) use of aspirin: Secondary | ICD-10-CM | POA: Insufficient documentation

## 2017-10-03 DIAGNOSIS — Y929 Unspecified place or not applicable: Secondary | ICD-10-CM | POA: Insufficient documentation

## 2017-10-03 MED ORDER — TETANUS-DIPHTH-ACELL PERTUSSIS 5-2.5-18.5 LF-MCG/0.5 IM SUSP
0.5000 mL | Freq: Once | INTRAMUSCULAR | Status: AC
  Administered 2017-10-03: 0.5 mL via INTRAMUSCULAR
  Filled 2017-10-03: qty 0.5

## 2017-10-03 MED ORDER — LIDOCAINE HCL (PF) 1 % IJ SOLN
10.0000 mL | Freq: Once | INTRAMUSCULAR | Status: AC
Start: 2017-10-03 — End: 2017-10-03
  Administered 2017-10-03: 10 mL
  Filled 2017-10-03: qty 10

## 2017-10-03 NOTE — ED Triage Notes (Addendum)
Pt states she cut left hand on broken plate approx 7pm-lac noted to left thumb base area-bleeding controlled-gauze/coban dsg in place-NAD-steady gait

## 2017-10-03 NOTE — ED Notes (Signed)
ED Provider at bedside. 

## 2017-10-03 NOTE — Discharge Instructions (Signed)
Treatment: Keep your wound dry and dressing applied until this time tomorrow. After 24 hours, you may wash with warm soapy water. Dry and apply antibiotic ointment and clean dressing. Do this daily until your sutures are removed. ° °Follow-up: Please follow-up with your primary care provider or return to emergency department in 7-10 days for suture removal. Be aware of signs of infection: fever, increasing pain, redness, swelling, drainage from the area. Please call your primary care provider or return to emergency department if you develop any of these symptoms or if any of the sutures come out prior to removal. Please return to the emergency department if you develop any other new or worsening symptoms. ° °

## 2017-10-04 NOTE — ED Provider Notes (Signed)
[facility] EMERGENCY DEPARTMENT Provider Note   CSN: [id number] Arrival date & time: 10/03/17  1925     History   Chief Complaint Chief Complaint  Patient presents with  . Extremity Laceration    HPI Anna Acevedo is a 83 y.o. female with history of hypertension, osteoporosis who presents with left hand laceration after she cut herself on a broken plate.  The plate was clean.  Her tetanus is not up-to-date.  She denies any numbness or tingling.  Bleeding difficult to control prior to arrival.  She takes a baby aspirin daily, but no other blood thinners.  She denies any other injuries.  Patient washed her hands with water prior to arrival.  HPI  Past Medical History:  Diagnosis Date  . Colon polyp   . Diverticulosis   . Dyspnea   . GERD (gastroesophageal reflux disease)   . Hypercholesterolemia   . Hypertension    Essential  . Obesity   . Osteoporosis    OSTEOPENIA  . Palpitations   . Vitamin D deficiency     Patient Active Problem List   Diagnosis Date Noted  . Osteopenia 09/19/2011  . Osteoarthritis 08/16/2011  . Benign hypertensive heart disease without heart failure 02/17/2011  . Hyperlipidemia 02/17/2011  . Palpitations 02/17/2011    Past Surgical History:  Procedure Laterality Date  . CARDIOVASCULAR STRESS TEST  03-30-2006   EF 72%. Showed no evidence of ischemia   . COLONOSCOPY     gets every 5 years  . EYE SURGERY Bilateral 2014   Cataracts  . US ECHOCARDIOGRAPHY  September 2007   EF 55-60%. Showing  impaired diastolic relaxation and mild mitral regurgitation    OB History    Gravida Para Term Preterm AB Living   2 2 2     2    SAB TAB Ectopic Multiple Live Births           2       Home Medications    Prior to Admission medications   Medication Sig Start Date End Date Taking? Authorizing Provider  acetaminophen (TYLENOL) 500 MG tablet Take 500 mg by mouth every 6 (six) hours as needed for mild pain.     [provider]  aspirin 81 MG tablet Take 81 mg by mouth daily.      [provider]  CALCIUM PO Take 1 tablet by mouth daily. 600 plus d    [provider]  losartan-hydrochlorothiazide (HYZAAR) 50-12.5 MG tablet Take 0.5 tablets by mouth daily. 10/03/16   Denita Lung, MD  metoprolol (LOPRESSOR) 50 MG tablet Take 1 tablet (50 mg total) by mouth 2 (two) times daily. 11/24/16   Hilty, Nadean Corwin, MD  Multiple Vitamin (MULTIVITAMIN PO) Take 1 tablet by mouth daily. Reported on 11/02/2015    [provider]  NONFORMULARY OR COMPOUNDED ITEM Estradiol Vag Cream S: insert 1 ml into vagina twice weekly. 08/29/16   Terrance Mass, MD  pravastatin (PRAVACHOL) 40 MG tablet Take 1 tablet (40 mg total) by mouth daily. 10/03/16   Denita Lung, MD    Family History Family History  Problem Relation Age of Onset  . Hypertension Mother     Social History Social History   Tobacco Use  . Smoking status: Never Smoker  . Smokeless tobacco: Never Used  Substance Use Topics  . Alcohol use: No    Alcohol/week: 0.0 oz  . Drug use: No     Allergies   Lisinopril;  Penicillins; Zocor [simvastatin]; Amoxicillin; and Cephalexin   Review of Systems Review of Systems  Constitutional: Negative for chills and fever.  HENT: Negative for facial swelling and sore throat.   Respiratory: Negative for shortness of breath.   Cardiovascular: Negative for chest pain.  Gastrointestinal: Negative for abdominal pain, nausea and vomiting.  Genitourinary: Negative for dysuria.  Musculoskeletal: Negative for back pain.  Skin: Positive for wound. Negative for rash.  Neurological: Negative for numbness.  Psychiatric/Behavioral: The patient is not nervous/anxious.      Physical Exam Updated Vital Signs BP (!) 145/73 (BP Location: Right Wrist)   Pulse 66   Temp 97.6 F (36.4 C) (Oral)   Resp 16   Ht 5\' 4"  (1.626 m)   Wt 83.5 kg (184 lb)   SpO2 94%   BMI 31.58 kg/m   Physical Exam    Constitutional: She appears well-developed and well-nourished. No distress.  HENT:  Head: Normocephalic and atraumatic.  Mouth/Throat: Oropharynx is clear and moist. No oropharyngeal exudate.  Eyes: Conjunctivae are normal. Pupils are equal, round, and reactive to light. Right eye exhibits no discharge. Left eye exhibits no discharge. No scleral icterus.  Neck: Normal range of motion. Neck supple. No thyromegaly present.  Cardiovascular: Normal rate, regular rhythm, normal heart sounds and intact distal pulses. Exam reveals no gallop and no friction rub.  No murmur heard. Pulmonary/Chest: Effort normal and breath sounds normal. No stridor. No respiratory distress. She has no wheezes. She has no rales.  Abdominal: Soft. Bowel sounds are normal. She exhibits no distension. There is no tenderness. There is no rebound and no guarding.  Musculoskeletal: She exhibits no edema.       Hands: Full range of motion of the thumb with flexion, extension, abduction, abduction; sensation is intact, radial pulses intact, cap refill less than 2 seconds  Lymphadenopathy:    She has no cervical adenopathy.  Neurological: She is alert. Coordination normal.  Skin: Skin is warm and dry. No rash noted. She is not diaphoretic. No pallor.  Psychiatric: She has a normal mood and affect.  Nursing note and vitals reviewed.    ED Treatments / Results  Labs (all labs ordered are listed, but only abnormal results are displayed) Labs Reviewed - No data to display  EKG  EKG Interpretation None       Radiology No results found.  Procedures .Marland KitchenLaceration Repair Date/Time: 10/04/2017 12:53 AM Performed by: Frederica Kuster, PA-C Authorized by: Frederica Kuster, PA-C   Consent:    Consent obtained:  Verbal   Consent given by:  Patient   Risks discussed:  Infection, vascular damage, nerve damage, pain, poor cosmetic result and poor wound healing Anesthesia (see MAR for exact dosages):    Anesthesia method:   Local infiltration   Local anesthetic:  Lidocaine 1% w/o epi Laceration details:    Location:  Hand   Hand location:  L hand, dorsum   Length (cm):  2.5   Depth (mm):  3 Repair type:    Repair type:  Simple Pre-procedure details:    Preparation:  Patient was prepped and draped in usual sterile fashion Exploration:    Hemostasis achieved with:  Direct pressure   Wound exploration: wound explored through full range of motion and entire depth of wound probed and visualized     Wound extent: no muscle damage noted, no nerve damage noted and no tendon damage noted     Contaminated: no   Treatment:    Area cleansed with:  Saline   Amount of cleaning:  Standard   Irrigation solution:  Sterile saline   Irrigation volume:  166mL   Irrigation method:  Syringe   Visualized foreign bodies/material removed: no   Skin repair:    Repair method:  Sutures   Suture size:  5-0   Wound skin closure material used: Ethilon.   Suture technique:  Simple interrupted   Number of sutures:  5 Approximation:    Approximation:  Close   Vermilion border: well-aligned   Post-procedure details:    Dressing:  Antibiotic ointment, non-adherent dressing and tube gauze   Patient tolerance of procedure:  Tolerated well, no immediate complications   (including critical care time)  Medications Ordered in ED Medications  Tdap (BOOSTRIX) injection 0.5 mL (0.5 mLs Intramuscular Given 10/03/17 2307)  lidocaine (PF) (XYLOCAINE) 1 % injection 10 mL (10 mLs Infiltration Given by Other 10/03/17 2310)     Initial Impression / Assessment and Plan / ED Course  I have reviewed the triage vital signs and the nursing notes.  Pertinent labs & imaging results that were available during my care of the patient were reviewed by me and considered in my medical decision making (see chart for details).     Tetanus updated in ED. Laceration occurred < 12 hours prior to repair. Discussed laceration care with pt and answered  questions. Pt to f-u for suture removal in 7-10 days and wound check sooner should there be signs of dehiscence or infection.  Wound is very clean appearing and I do not feel prophylaxis is indicated at this time.  Patient is nondiabetic.  Patient understands and agrees with plan.  Patient vitals stable throughout ED course and discharged in satisfactory condition.  Patient also evaluated by Dr. Florina Ou who guided the patient's management and agrees with plan.   Final Clinical Impressions(s) / ED Diagnoses   Final diagnoses:  Laceration of left hand without foreign body, initial encounter    ED Discharge Orders    None       Frederica Kuster, PA-C 10/04/17 0055    Shanon Rosser, MD 10/04/17 [phone]

## 2017-10-05 ENCOUNTER — Other Ambulatory Visit: Payer: Self-pay | Admitting: Internal Medicine

## 2017-10-05 DIAGNOSIS — I119 Hypertensive heart disease without heart failure: Secondary | ICD-10-CM

## 2017-10-06 NOTE — Telephone Encounter (Signed)
Rx has been sent to the pharmacy electronically. ° °

## 2017-10-09 ENCOUNTER — Other Ambulatory Visit: Payer: Self-pay

## 2017-10-09 DIAGNOSIS — I119 Hypertensive heart disease without heart failure: Secondary | ICD-10-CM

## 2017-10-09 MED ORDER — LOSARTAN POTASSIUM-HCTZ 50-12.5 MG PO TABS: 0.5000 | ORAL_TABLET | Freq: Every day | ORAL | 8 refills | Status: DC

## 2017-10-10 ENCOUNTER — Telehealth: Payer: Self-pay | Admitting: Internal Medicine

## 2017-10-10 MED ORDER — HYDROCHLOROTHIAZIDE 12.5 MG PO CAPS: 12.5000 mg | ORAL_CAPSULE | Freq: Every day | ORAL | 0 refills | Status: DC

## 2017-10-10 MED ORDER — LOSARTAN POTASSIUM 50 MG PO TABS: 50.0000 mg | ORAL_TABLET | Freq: Every day | ORAL | 0 refills | Status: DC

## 2017-10-10 NOTE — Telephone Encounter (Signed)
Rx(s) sent to pharmacy electronically.  

## 2017-10-10 NOTE — Telephone Encounter (Signed)
Returned call to patient of Dr. Debara Pickett who [facility] losartan-hctz, it's not in stock. Advised patient would check with pharmacy and if this is the case, we will need to change her medication.   [facility] [place] and confirmed that losartan-hctz 50-12.5mg  is unavailable/on backorder  Routed to [facility] to advise on med change

## 2017-10-10 NOTE — Telephone Encounter (Signed)
° °  Pt c/o medication issue:  1. Name of Medication: losartan-hydrochlorothiazide (HYZAAR) 50-12.5 MG tablet    2. How are you currently taking this medication (dosage and times per day)? Take 0.5 tablets by mouth daily. 3. Are you having a reaction (difficulty breathing--STAT)? no 4. What is your medication issue? Pt has two pills left and pharmacy states that they dont have any

## 2017-10-10 NOTE — Telephone Encounter (Signed)
Several pharmacies have noted that they don't have the combination product, but they can get each component individually.  See if her pharmacy can do losartan 50 mg qd and hctz 12.5 mg qd

## 2017-10-11 ENCOUNTER — Other Ambulatory Visit: Payer: Self-pay | Admitting: Internal Medicine

## 2017-10-11 MED ORDER — HYDROCHLOROTHIAZIDE 12.5 MG PO TABS: 6.2500 mg | ORAL_TABLET | Freq: Every day | ORAL | 3 refills | Status: DC

## 2017-10-11 NOTE — Telephone Encounter (Signed)
Pt calling stating that she has questions about a medication that she picked up from her pharmacy that Dr. Debara Pickett prescribed for her and would Dr. Lysbeth Penner nurse Eliezer Lofts, RN to give her a call back. Please address

## 2017-10-11 NOTE — Telephone Encounter (Addendum)
Returned call to patient. She was on losartan-hctz 50-12.5mg  tablet (one-half tab QD = 25-6.25mg ). This was on backorder and it was advised on 3/26 to change to separate pills. Original Rx was for capsules, which cannot be cut in half. Re-sent med in for hctz 12.5mg  tablets with instructions to take 1/2 tablet QD. LM for patient with this info/call back with questions.

## 2017-10-12 ENCOUNTER — Encounter: Payer: Self-pay | Admitting: Family Medicine

## 2017-10-12 ENCOUNTER — Ambulatory Visit (INDEPENDENT_AMBULATORY_CARE_PROVIDER_SITE_OTHER): Payer: Medicare Other | Admitting: Family Medicine

## 2017-10-12 VITALS — BP 126/86 | HR 62 | Wt 184.0 lb

## 2017-10-12 DIAGNOSIS — Z4802 Encounter for removal of sutures: Secondary | ICD-10-CM

## 2017-10-12 NOTE — Progress Notes (Signed)
   Subjective:    Patient ID: Anna Acevedo, female    DOB: [birth year], 83 y.o.   MRN: [id number]  HPI She is here for suture removal.  She did have been placed 1 week ago while she is in the emergency room.  She is having no difficulty with them.   Review of Systems     Objective:   Physical Exam Exam of the left thumb proximal dorsal surface shows 4 sutures.  The area seems to be healing nicely.      Assessment & Plan:  Visit for suture removal Sutures were removed without difficulty.  Steri-Strips applied.  Recommend she keep them on for the next several days.

## 2017-10-23 ENCOUNTER — Other Ambulatory Visit: Payer: Self-pay | Admitting: Family Medicine

## 2017-10-23 ENCOUNTER — Telehealth: Payer: Self-pay

## 2017-10-23 DIAGNOSIS — E785 Hyperlipidemia, unspecified: Secondary | ICD-10-CM

## 2017-10-23 NOTE — Telephone Encounter (Signed)
Pt was called to schedule a med mgt or annual wellness visit with Dr. Redmond School. Has not had either in over a year. No answer and lvm. [place]

## 2017-11-10 ENCOUNTER — Encounter

## 2017-11-10 ENCOUNTER — Encounter: Payer: Self-pay | Admitting: Internal Medicine

## 2017-11-10 ENCOUNTER — Ambulatory Visit: Payer: Medicare Other | Admitting: Internal Medicine

## 2017-11-10 VITALS — BP 148/76 | HR 56 | Ht 64.0 in | Wt 184.0 lb

## 2017-11-10 DIAGNOSIS — E782 Mixed hyperlipidemia: Secondary | ICD-10-CM | POA: Diagnosis not present

## 2017-11-10 DIAGNOSIS — I10 Essential (primary) hypertension: Secondary | ICD-10-CM | POA: Diagnosis not present

## 2017-11-10 DIAGNOSIS — R002 Palpitations: Secondary | ICD-10-CM

## 2017-11-10 LAB — LIPID PANEL
CHOL/HDL RATIO: 4.7 ratio — AB (ref 0.0–4.4)
Cholesterol, Total: 191 mg/dL (ref 100–199)
HDL: 41 mg/dL (ref >39)
LDL Calculated: 114 mg/dL — ABNORMAL HIGH (ref 0–99)
Triglycerides: 179 mg/dL — ABNORMAL HIGH (ref 0–149)
VLDL Cholesterol Cal: 36 mg/dL (ref 5–40)

## 2017-11-10 NOTE — Patient Instructions (Signed)
Your physician recommends that you return for lab work FASTING to check cholesterol   Your physician wants you to follow-up in: ONE YEAR with Dr. Hilty. You will receive a reminder letter in the mail two months in advance. If you don't receive a letter, please call our office to schedule the follow-up appointment.   

## 2017-11-10 NOTE — Progress Notes (Signed)
OFFICE NOTE  Chief Complaint:  No complaints  Primary Care Physician: Denita Lung, MD  HPI:  Anna Acevedo is a 83 y.o. female who is a former patient of Dr. Mare Ferrari. She has a history of hypertension, dyslipidemia, obesity and palpitations in the past. She initially saw Dr. Mare Ferrari 2007 and underwent stress testing at that time. Workup was negative and he instituted medical therapy. Since then she's done very well. He sees are basically annually until his retirement recently. Over the past year she has no new complaints. She denies shortness of breath or chest pain. She has no active palpitations. EKG shows sinus rhythm at 63 today. Blood pressure initially was elevated 162/80 however came and 134/78 at the end of the visit. Reports good sleep at night and energy level is reasonable during the day. She does have a follow-up with her primary care provider in a few weeks at which time she'll have routine blood work including a lipid profile.  11/10/2017  Anna Acevedo returns today for follow-up.  Overall she is doing well.  Her weight is up about 4 pounds.  She denies chest pain or worsening shortness of breath.  Blood pressure is reasonably well controlled.  EKG shows sinus bradycardia with poor R wave progression anteriorly at 56.  PMHx:  Past Medical History:  Diagnosis Date  . Colon polyp   . Diverticulosis   . Dyspnea   . GERD (gastroesophageal reflux disease)   . Hypercholesterolemia   . Hypertension    Essential  . Obesity   . Osteoporosis    OSTEOPENIA  . Palpitations   . Vitamin D deficiency     Past Surgical History:  Procedure Laterality Date  . CARDIOVASCULAR STRESS TEST  03-30-2006   EF 72%. Showed no evidence of ischemia   . COLONOSCOPY     gets every 5 years  . EYE SURGERY Bilateral 2014   Cataracts  . US ECHOCARDIOGRAPHY  September 2007   EF 55-60%. Showing  impaired diastolic relaxation and mild mitral regurgitation    FAMHx:  Family History    Problem Relation Age of Onset  . Hypertension Mother     SOCHx:   reports that she has never smoked. She has never used smokeless tobacco. She reports that she does not drink alcohol or use drugs.  ALLERGIES:  Allergies  Allergen Reactions  . Lisinopril     cough  . Penicillins Hives and Swelling  . Zocor [Simvastatin]     Muscle Pain  . Amoxicillin Rash  . Cephalexin Rash    ROS: Pertinent items noted in HPI and remainder of comprehensive ROS otherwise negative.  HOME MEDS: Current Outpatient Medications on File Prior to Visit  Medication Sig Dispense Refill  . acetaminophen (TYLENOL) 500 MG tablet Take 500 mg by mouth every 6 (six) hours as needed for mild pain.     Marland Kitchen aspirin 81 MG tablet Take 81 mg by mouth daily.      Marland Kitchen CALCIUM PO Take 1 tablet by mouth daily. 600 plus d    . hydrochlorothiazide (HYDRODIURIL) 12.5 MG tablet Take 0.5 tablets (6.25 mg total) by mouth daily. 15 tablet 3  . losartan (COZAAR) 50 MG tablet Take 25 mg by mouth daily.    . metoprolol (LOPRESSOR) 50 MG tablet Take 1 tablet (50 mg total) by mouth 2 (two) times daily. 180 tablet 3  . Multiple Vitamin (MULTIVITAMIN PO) Take 1 tablet by mouth daily. Reported on 11/02/2015    .  pravastatin (PRAVACHOL) 40 MG tablet TAKE 1 TABLET BY MOUTH EVERY DAY 90 tablet 0   No current facility-administered medications on file prior to visit.     LABS/IMAGING: No results found for this or any previous visit (from the past 48 hour(s)). No results found.  WEIGHTS: Wt Readings from Last 3 Encounters:  11/10/17 184 lb (83.5 kg)  10/12/17 184 lb (83.5 kg)  10/03/17 184 lb (83.5 kg)    VITALS: BP (!) 148/76 (BP Location: Left Arm, Patient Position: Sitting, Cuff Size: Large)   Pulse (!) 56   Ht 5\' 4"  (1.626 m)   Wt 184 lb (83.5 kg)   BMI 31.58 kg/m   EXAM: General appearance: alert and no distress Neck: no carotid bruit and no JVD Lungs: clear to auscultation bilaterally Heart: regular rate and  rhythm Abdomen: soft, non-tender; bowel sounds normal; no masses,  no organomegaly Extremities: extremities normal, atraumatic, no cyanosis or edema Pulses: 2+ and symmetric Skin: Skin color, texture, turgor normal. No rashes or lesions Neurologic: Grossly normal Psych: Pleasant  EKG: As bradycardia 56, poor R wave progression-personally reviewed  ASSESSMENT: 1. Hypertension-controlled 2. Dyslipidemia on pravastatin 3. Obesity 4. History of palpitations-resolved  PLAN: 1.   Anna Acevedo he is to do well and is generally asymptomatic.  She denies chest pain or worsening shortness of breath.  Blood pressure is little elevated today however has been up and down.  Her weight is been stable.  She denies any current palpitations.  Plan follow-up with me annually or sooner as necessary.  Pixie Casino, MD, [facility], [place] Director of the Advanced Lipid Disorders &  Cardiovascular Risk Reduction Clinic Diplomate of the American Board of Clinical Lipidology Attending Cardiologist  Direct Dial: [phone]  Fax: [phone]  Website:  [email or web address]   Anna Acevedo 11/10/2017, 9:25 AM

## 2017-11-12 ENCOUNTER — Encounter: Payer: Self-pay | Admitting: Internal Medicine

## 2017-11-12 DIAGNOSIS — I10 Essential (primary) hypertension: Secondary | ICD-10-CM | POA: Insufficient documentation

## 2018-01-17 ENCOUNTER — Other Ambulatory Visit: Payer: Self-pay | Admitting: Family Medicine

## 2018-01-17 DIAGNOSIS — E785 Hyperlipidemia, unspecified: Secondary | ICD-10-CM

## 2018-02-01 ENCOUNTER — Other Ambulatory Visit: Payer: Self-pay | Admitting: Internal Medicine

## 2018-02-01 DIAGNOSIS — I119 Hypertensive heart disease without heart failure: Secondary | ICD-10-CM

## 2018-02-01 NOTE — Telephone Encounter (Signed)
Rx request sent to pharmacy.  

## 2018-04-12 ENCOUNTER — Other Ambulatory Visit: Payer: Self-pay | Admitting: Family Medicine

## 2018-04-12 DIAGNOSIS — E785 Hyperlipidemia, unspecified: Secondary | ICD-10-CM

## 2018-06-05 ENCOUNTER — Telehealth: Payer: Self-pay

## 2018-06-05 NOTE — Telephone Encounter (Signed)
Called pt to advise of wellness appointments needed. LVM [place]

## 2018-07-06 ENCOUNTER — Other Ambulatory Visit: Payer: Self-pay | Admitting: Family Medicine

## 2018-07-06 DIAGNOSIS — E785 Hyperlipidemia, unspecified: Secondary | ICD-10-CM

## 2018-09-03 DIAGNOSIS — H04123 Dry eye syndrome of bilateral lacrimal glands: Secondary | ICD-10-CM | POA: Diagnosis not present

## 2018-09-03 DIAGNOSIS — D23111 Other benign neoplasm of skin of right upper eyelid, including canthus: Secondary | ICD-10-CM | POA: Diagnosis not present

## 2018-09-03 DIAGNOSIS — H35033 Hypertensive retinopathy, bilateral: Secondary | ICD-10-CM | POA: Diagnosis not present

## 2018-09-06 ENCOUNTER — Ambulatory Visit (INDEPENDENT_AMBULATORY_CARE_PROVIDER_SITE_OTHER): Payer: Medicare Other | Admitting: Medical

## 2018-09-06 ENCOUNTER — Encounter: Payer: Self-pay | Admitting: Medical

## 2018-09-06 VITALS — BP 140/70 | HR 78 | Temp 98.1°F | Resp 16 | Ht 64.0 in | Wt 186.4 lb

## 2018-09-06 DIAGNOSIS — J40 Bronchitis, not specified as acute or chronic: Secondary | ICD-10-CM

## 2018-09-06 DIAGNOSIS — J329 Chronic sinusitis, unspecified: Secondary | ICD-10-CM | POA: Diagnosis not present

## 2018-09-06 MED ORDER — BENZONATATE 100 MG PO CAPS: 100.0000 mg | ORAL_CAPSULE | Freq: Two times a day (BID) | ORAL | 0 refills | Status: DC | PRN

## 2018-09-06 MED ORDER — AZITHROMYCIN 250 MG PO TABS: ORAL_TABLET | ORAL | 0 refills | Status: DC

## 2018-09-06 MED ORDER — ALBUTEROL SULFATE HFA 108 (90 BASE) MCG/ACT IN AERS: 2.0000 | INHALATION_SPRAY | Freq: Four times a day (QID) | RESPIRATORY_TRACT | 0 refills | Status: DC | PRN

## 2018-09-06 NOTE — Progress Notes (Signed)
Subjective:  AMELIYA NICOTRA is a 84 y.o. female who presents for illness, cough.  She notes 5 day hx/o cough, head pressure, some sore throat, runny nose, sneezing.   No body aches, no chills, no fever.   No NVD.   No wheezing, no SOB.   No hx/o lung disease, nonsmoker.   reports sick contacts. Using some Coricidin HBP. No other aggravating or relieving factors.  No other c/o.  The following portions of the patient's history were reviewed and updated as appropriate: allergies, current medications, past family history, past medical history, past social history, past surgical history and problem list.  ROS as in subjective  Past Medical History:  Diagnosis Date  . Colon polyp   . Diverticulosis   . Dyspnea   . GERD (gastroesophageal reflux disease)   . Hypercholesterolemia   . Hypertension    Essential  . Obesity   . Osteoporosis    OSTEOPENIA  . Palpitations   . Vitamin D deficiency     ROS as in subjective    Objective: BP 140/70   Pulse 78   Temp 98.1 F (36.7 C) (Oral)   Resp 16   Ht 5\' 4"  (1.626 m)   Wt 186 lb 6.4 oz (84.6 kg)   SpO2 93%   BMI 32.00 kg/m   General appearance: Alert, WD/WN, no distress,mildly ill appearing                             Skin: warm, no rash, no diaphoresis                           Head:+sinus tenderness                            Eyes: conjunctiva normal, corneas clear, PERRLA                            Ears: pearly TMs, external ear canals normal                          Nose: septum midline, turbinates swollen, with erythema and clear discharge             Mouth/throat: MMM, tongue normal, mild pharyngeal erythema                           Neck: supple, no adenopathy, no thyromegaly, nontender                          Heart: RRR, normal S1, S2, no murmurs                         Lungs: +bronchial breath sounds, +scattered rhonchi, no wheezes, no rales                Extremities: no edema, nontender       Assessment: Encounter Diagnosis  Name Primary?  . Sinobronchitis Yes     Plan:  Discussed diagnosis and treatment.  Advised rest, hydration, medications below.   Call/return in 2-3 days if symptoms are worse or not improving.  Adreonna was seen today for cold.  Diagnoses and all orders for this visit:  Sinobronchitis -  Pulse oximetry (single); Future  Other orders -     azithromycin (ZITHROMAX) 250 MG tablet; 2 tablets day 1, then 1 tablet days 2-4 -     benzonatate (TESSALON) 100 MG capsule; Take 1 capsule (100 mg total) by mouth 2 (two) times daily as needed for cough. -     albuterol (PROVENTIL HFA;VENTOLIN HFA) 108 (90 Base) MCG/ACT inhaler; Inhale 2 puffs into the lungs every 6 (six) hours as needed for wheezing or shortness of breath.

## 2018-09-28 ENCOUNTER — Other Ambulatory Visit: Payer: Self-pay | Admitting: Family Medicine

## 2018-09-28 DIAGNOSIS — E785 Hyperlipidemia, unspecified: Secondary | ICD-10-CM

## 2018-10-10 ENCOUNTER — Other Ambulatory Visit: Payer: Self-pay

## 2018-10-10 ENCOUNTER — Encounter: Payer: Self-pay | Admitting: Family Medicine

## 2018-10-10 ENCOUNTER — Ambulatory Visit (INDEPENDENT_AMBULATORY_CARE_PROVIDER_SITE_OTHER): Payer: Medicare Other | Admitting: Family Medicine

## 2018-10-10 VITALS — BP 128/82 | HR 76 | Temp 97.9°F | Ht 64.0 in | Wt 182.2 lb

## 2018-10-10 DIAGNOSIS — R002 Palpitations: Secondary | ICD-10-CM

## 2018-10-10 DIAGNOSIS — I119 Hypertensive heart disease without heart failure: Secondary | ICD-10-CM

## 2018-10-10 DIAGNOSIS — M150 Primary generalized (osteo)arthritis: Secondary | ICD-10-CM

## 2018-10-10 DIAGNOSIS — Z8639 Personal history of other endocrine, nutritional and metabolic disease: Secondary | ICD-10-CM

## 2018-10-10 DIAGNOSIS — I10 Essential (primary) hypertension: Secondary | ICD-10-CM

## 2018-10-10 DIAGNOSIS — M8580 Other specified disorders of bone density and structure, unspecified site: Secondary | ICD-10-CM

## 2018-10-10 DIAGNOSIS — E785 Hyperlipidemia, unspecified: Secondary | ICD-10-CM

## 2018-10-10 DIAGNOSIS — M159 Polyosteoarthritis, unspecified: Secondary | ICD-10-CM

## 2018-10-10 MED ORDER — METOPROLOL TARTRATE 50 MG PO TABS: 50.0000 mg | ORAL_TABLET | Freq: Two times a day (BID) | ORAL | 3 refills | Status: DC

## 2018-10-10 MED ORDER — PRAVASTATIN SODIUM 40 MG PO TABS: 40.0000 mg | ORAL_TABLET | Freq: Every day | ORAL | 3 refills | Status: DC

## 2018-10-10 NOTE — Progress Notes (Signed)
Anna Acevedo is a 84 y.o. female who presents for annual wellness visit and follow-up on chronic medical conditions.  She has no particular concerns or complaints other than arthritic type pain that she is seems to be handling well.  She continues on pravastatin and is having no difficulty with that.  She is also taking metoprolol.  There is a question is whether she is taking the combination medication or separating out her ARB and the diuretic.  She has a previous history of osteopenia and has been taking occasional vitamin D.  She also has a history of palpitations but none recently.  Immunizations and Health Maintenance Immunization History  Administered Date(s) Administered  . DTaP 04/27/2004  . Influenza Whole 06/03/2011  . Pneumococcal Conjugate-13 05/23/2007  . Pneumococcal Polysaccharide-23 02/11/2013  . Tdap 07/19/2007, 10/03/2017  . Zoster 07/15/2008   Health Maintenance Due  Topic Date Due  . INFLUENZA VACCINE  02/15/2018    Last Pap smear:aged out  Last mammogram: over five years Last colonoscopy:06-25-2008 Last DEXA:12-09-14 Dentist: last year Ophtho:2/20 Exercise: active at home  Other doctors caring for patient include:Dr. Hilty cardio  Advanced directives:yes copy in the chart    Depression screen:  See questionnaire below.  Depression screen [facility] 2/9 10/10/2018 10/03/2016 09/19/2011  Decreased Interest 0 0 0  Down, Depressed, Hopeless 0 0 0  PHQ - 2 Score 0 0 0    Fall Risk Screen: see questionnaire below. Fall Risk  10/10/2018 10/03/2016  Falls in the past year? 0 No    ADL screen:  See questionnaire below Functional Status Survey: Is the patient deaf or have difficulty hearing?: No Does the patient have difficulty seeing, even when wearing glasses/contacts?: No Does the patient have difficulty concentrating, remembering, or making decisions?: No Does the patient have difficulty walking or climbing stairs?: No Does the patient have difficulty dressing  or bathing?: No Does the patient have difficulty doing errands alone such as visiting a doctor's office or shopping?: No   Review of Systems Constitutional: -, -unexpected weight change, -anorexia, -fatigue Allergy: -sneezing, -itching, -congestion Dermatology: denies changing moles, rash, lumps ENT: -runny nose, -ear pain, -sore throat,  Cardiology:  -chest pain, -palpitations, -orthopnea, Respiratory: -cough, -shortness of breath, -dyspnea on exertion, -wheezing,  Gastroenterology: -abdominal pain, -nausea, -vomiting, -diarrhea, -constipation, -dysphagia Hematology: -bleeding or bruising problems Musculoskeletal: -arthralgias, -myalgias, -joint swelling, -back pain, - Ophthalmology: -vision changes,  Urology: -dysuria, -difficulty urinating,  -urinary frequency, -urgency, incontinence Neurology: -, -numbness, , -memory loss, -falls, -dizziness    PHYSICAL EXAM:  BP 128/82 (BP Location: Left Arm, Patient Position: Sitting)   Pulse 76   Temp 97.9 F (36.6 C)   Ht 5\' 4"  (1.626 m)   Wt 182 lb 3.2 oz (82.6 kg)   SpO2 95%   BMI 31.27 kg/m   General Appearance: Alert, cooperative, no distress, appears stated age Head: Normocephalic, without obvious abnormality, atraumatic Eyes: PERRL, conjunctiva/corneas clear, EOM's intact, fundi benign Ears: Normal TM's and external ear canals Nose: Nares normal, mucosa normal, no drainage or sinus tenderness Throat: Lips, mucosa, and tongue normal; teeth and gums normal Neck: Supple, no lymphadenopathy;  thyroid:  no enlargement/tenderness/nodules; no carotid bruit or JVD Lungs: Clear to auscultation bilaterally without wheezes, rales or ronchi; respirations unlabored Heart: Regular rate and rhythm, S1 and S2 normal, no murmur, rubor gallop Extremities: No clubbing, cyanosis or edema Pulses: 2+ and symmetric all extremities Skin:  Skin color, texture, turgor normal, no rashes or lesions Lymph nodes: Cervical, supraclavicular, and axillary  nodes normal Neurologic:  CNII-XII intact, normal strength, sensation and gait; reflexes 2+ and symmetric throughout Psych: Normal mood, affect, hygiene and grooming.  ASSESSMENT/PLAN: Benign hypertensive heart disease without heart failure - Plan: metoprolol tartrate (LOPRESSOR) 50 MG tablet, DISCONTINUED: metoprolol tartrate (LOPRESSOR) 50 MG tablet, DISCONTINUED: metoprolol tartrate (LOPRESSOR) 50 MG tablet  Hyperlipidemia, unspecified hyperlipidemia type - Plan: Lipid panel, pravastatin (PRAVACHOL) 40 MG tablet, DISCONTINUED: pravastatin (PRAVACHOL) 40 MG tablet, DISCONTINUED: pravastatin (PRAVACHOL) 40 MG tablet  Benign essential hypertension - Plan: CBC with Differential/Platelet, Comprehensive metabolic panel  Primary osteoarthritis involving multiple joints  Osteopenia, unspecified location - Plan: VITAMIN D 25 Hydroxy (Vit-D Deficiency, Fractures)  Palpitations  H/O vitamin D deficiency Encouraged her to get the Shingrix vaccine. She will continue on her present medication regimens.  She will call back in me know about the blood pressure medication that she is taking.  Otherwise recheck here as needed.    Medicare Attestation I have personally reviewed: The patient's medical and social history Their use of alcohol, tobacco or illicit drugs Their current medications and supplements The patient's functional ability including ADLs,fall risks, home safety risks, cognitive, and hearing and visual impairment Diet and physical activities Evidence for depression or mood disorders  The patient's weight, height, and BMI have been recorded in the chart.  I have made referrals, counseling, and provided education to the patient based on review of the above and I have provided the patient with a written personalized care plan for preventive services.     Anna Alexanders, MD   10/10/2018

## 2018-10-11 LAB — CBC WITH DIFFERENTIAL/PLATELET
BASOS ABS: 0.1 10*3/uL (ref 0.0–0.2)
Basos: 1 %
EOS (ABSOLUTE): 0.5 10*3/uL — ABNORMAL HIGH (ref 0.0–0.4)
EOS: 6 %
HEMATOCRIT: 42.2 % (ref 34.0–46.6)
HEMOGLOBIN: 14.1 g/dL (ref 11.1–15.9)
IMMATURE GRANS (ABS): 0.1 10*3/uL (ref 0.0–0.1)
IMMATURE GRANULOCYTES: 1 %
Lymphocytes Absolute: 1.6 10*3/uL (ref 0.7–3.1)
Lymphs: 18 %
MCH: 27 pg (ref 26.6–33.0)
MCHC: 33.4 g/dL (ref 31.5–35.7)
MCV: 81 fL (ref 79–97)
MONOCYTES: 8 %
Monocytes Absolute: 0.7 10*3/uL (ref 0.1–0.9)
NEUTROS PCT: 66 %
Neutrophils Absolute: 5.9 10*3/uL (ref 1.4–7.0)
Platelets: 266 10*3/uL (ref 150–450)
RBC: 5.23 x10E6/uL (ref 3.77–5.28)
RDW: 14.5 % (ref 11.7–15.4)
WBC: 8.9 10*3/uL (ref 3.4–10.8)

## 2018-10-11 LAB — COMPREHENSIVE METABOLIC PANEL
ALBUMIN: 4.5 g/dL (ref 3.6–4.6)
ALK PHOS: 63 IU/L (ref 39–117)
ALT: 14 IU/L (ref 0–32)
AST: 21 IU/L (ref 0–40)
Albumin/Globulin Ratio: 1.8 (ref 1.2–2.2)
BUN / CREAT RATIO: 17 (ref 12–28)
BUN: 15 mg/dL (ref 8–27)
Bilirubin Total: 0.5 mg/dL (ref 0.0–1.2)
CALCIUM: 9.6 mg/dL (ref 8.7–10.3)
CO2: 24 mmol/L (ref 20–29)
Chloride: 102 mmol/L (ref 96–106)
Creatinine, Ser: 0.88 mg/dL (ref 0.57–1.00)
GFR calc Af Amer: 71 mL/min/{1.73_m2} (ref >59)
GFR, EST NON AFRICAN AMERICAN: 62 mL/min/{1.73_m2} (ref >59)
GLOBULIN, TOTAL: 2.5 g/dL (ref 1.5–4.5)
GLUCOSE: 96 mg/dL (ref 65–99)
Potassium: 4.5 mmol/L (ref 3.5–5.2)
Sodium: 141 mmol/L (ref 134–144)
TOTAL PROTEIN: 7 g/dL (ref 6.0–8.5)

## 2018-10-11 LAB — LIPID PANEL
CHOL/HDL RATIO: 4.5 ratio — AB (ref 0.0–4.4)
Cholesterol, Total: 166 mg/dL (ref 100–199)
HDL: 37 mg/dL — AB (ref >39)
LDL CALC: 97 mg/dL (ref 0–99)
Triglycerides: 159 mg/dL — ABNORMAL HIGH (ref 0–149)
VLDL Cholesterol Cal: 32 mg/dL (ref 5–40)

## 2018-10-11 LAB — VITAMIN D 25 HYDROXY (VIT D DEFICIENCY, FRACTURES): VIT D 25 HYDROXY: 21.7 ng/mL — AB (ref 30.0–100.0)

## 2018-10-18 ENCOUNTER — Other Ambulatory Visit: Payer: Self-pay | Admitting: Internal Medicine

## 2018-10-18 DIAGNOSIS — L820 Inflamed seborrheic keratosis: Secondary | ICD-10-CM | POA: Diagnosis not present

## 2018-10-18 DIAGNOSIS — L821 Other seborrheic keratosis: Secondary | ICD-10-CM | POA: Diagnosis not present

## 2018-10-18 NOTE — Telephone Encounter (Signed)
Losartan-HCT 50/12.5 refilled.

## 2018-11-20 ENCOUNTER — Telehealth: Payer: Self-pay | Admitting: Internal Medicine

## 2018-11-20 NOTE — Telephone Encounter (Signed)
New message     Virtual video visit scheduled for 11-21-18.  Pt gave consent for video visit.  YOUR CARDIOLOGY TEAM HAS ARRANGED FOR AN E-VISIT FOR YOUR APPOINTMENT - PLEASE REVIEW IMPORTANT INFORMATION BELOW SEVERAL DAYS PRIOR TO YOUR APPOINTMENT  Due to the recent COVID-19 pandemic, we are transitioning in-person office visits to tele-medicine visits in an effort to decrease unnecessary exposure to our patients, their families, and staff. These visits are billed to your insurance just like a normal visit is. We also encourage you to sign up for MyChart if you have not already done so. You will need a smartphone if possible. For patients that do not have this, we can still complete the visit using a regular telephone but do prefer a smartphone to enable video when possible. You may have a family member that lives with you that can help. If possible, we also ask that you have a blood pressure cuff and scale at home to measure your blood pressure, heart rate and weight prior to your scheduled appointment. Patients with clinical needs that need an in-person evaluation and testing will still be able to come to the office if absolutely necessary. If you have any questions, feel free to call our office.     YOUR PROVIDER WILL BE USING THE FOLLOWING PLATFORM TO COMPLETE YOUR VISIT: Doximity   IF USING MYCHART - How to Download the MyChart App to Your SmartPhone   - If Apple, go to [employer] and type in MyChart in the search bar and download the app. If Android, ask patient to go to [employer] and type in [place] in the search bar and download the app. The app is free but as with any other app downloads, your phone may require you to verify saved payment information or Apple/Android password.  - You will need to then log into the app with your MyChart username and password, and select [facility] as your healthcare provider to link the account.  - When it is time for your visit, go to the MyChart  app, find appointments, and click Begin Video Visit. Be sure to Select Allow for your device to access the Microphone and Camera for your visit. You will then be connected, and your provider will be with you shortly.  **If you have any issues connecting or need assistance, please contact MyChart service desk [phone])**  **If using a computer, in order to ensure the best quality for your visit, you will need to use either of the following Internet Browsers: Insurance underwriter or Microsoft Edge**   IF USING DOXIMITY or [email or web address] - The staff will give you instructions on receiving your link to join the meeting the day of your visit.      2-3 DAYS BEFORE YOUR APPOINTMENT  You will receive a telephone call from one of our [facility] team members - your caller ID may say "Unknown caller." If this is a video visit, we will walk you through how to get the video launched on your phone. We will remind you check your blood pressure, heart rate and weight prior to your scheduled appointment. If you have an Apple Watch or Kardia, please upload any pertinent ECG strips the day before or morning of your appointment to [facility]. Our staff will also make sure you have reviewed the consent and agree to move forward with your scheduled tele-health visit.     THE DAY OF YOUR APPOINTMENT  Approximately 15 minutes prior to your scheduled appointment,  you will receive a telephone call from one of [facility] team - your caller ID may say "Unknown caller."  Our staff will confirm medications, vital signs for the day and any symptoms you may be experiencing. Please have this information available prior to the time of visit start. It may also be helpful for you to have a pad of paper and pen handy for any instructions given during your visit. They will also walk you through joining the smartphone meeting if this is a video visit.    CONSENT FOR TELE-HEALTH VISIT - PLEASE REVIEW  I hereby voluntarily request,  consent and authorize [facility] and its employed or contracted physicians, physician assistants, nurse practitioners or other licensed health care professionals (the Practitioner), to provide me with telemedicine health care services (the Services") as deemed necessary by the treating Practitioner. I acknowledge and consent to receive the Services by the Practitioner via telemedicine. I understand that the telemedicine visit will involve communicating with the Practitioner through live audiovisual communication technology and the disclosure of certain medical information by electronic transmission. I acknowledge that I have been given the opportunity to request an in-person assessment or other available alternative prior to the telemedicine visit and am voluntarily participating in the telemedicine visit.  I understand that I have the right to withhold or withdraw my consent to the use of telemedicine in the course of my care at any time, without affecting my right to future care or treatment, and that the Practitioner or I may terminate the telemedicine visit at any time. I understand that I have the right to inspect all information obtained and/or recorded in the course of the telemedicine visit and may receive copies of available information for a reasonable fee.  I understand that some of the potential risks of receiving the Services via telemedicine include:   Delay or interruption in medical evaluation due to technological equipment failure or disruption;  Information transmitted may not be sufficient (e.g. poor resolution of images) to allow for appropriate medical decision making by the Practitioner; and/or   In rare instances, security protocols could fail, causing a breach of personal health information.  Furthermore, I acknowledge that it is my responsibility to provide information about my medical history, conditions and care that is complete and accurate to the best of my ability. I  acknowledge that Practitioner's advice, recommendations, and/or decision may be based on factors not within their control, such as incomplete or inaccurate data provided by me or distortions of diagnostic images or specimens that may result from electronic transmissions. I understand that the practice of medicine is not an exact science and that Practitioner makes no warranties or guarantees regarding treatment outcomes. I acknowledge that I will receive a copy of this consent concurrently upon execution via email to the email address I last provided but may also request a printed copy by calling the office of [facility].    I understand that my insurance will be billed for this visit.   I have read or had this consent read to me.  I understand the contents of this consent, which adequately explains the benefits and risks of the Services being provided via telemedicine.   I have been provided ample opportunity to ask questions regarding this consent and the Services and have had my questions answered to my satisfaction.  I give my informed consent for the services to be provided through the use of telemedicine in my medical care  By participating in this telemedicine visit  I agree to the above.

## 2018-11-21 ENCOUNTER — Telehealth (INDEPENDENT_AMBULATORY_CARE_PROVIDER_SITE_OTHER): Payer: Medicare Other | Admitting: Internal Medicine

## 2018-11-21 ENCOUNTER — Encounter: Payer: Self-pay | Admitting: Internal Medicine

## 2018-11-21 VITALS — BP 165/83 | HR 68 | Ht 64.0 in | Wt 175.0 lb

## 2018-11-21 DIAGNOSIS — E782 Mixed hyperlipidemia: Secondary | ICD-10-CM

## 2018-11-21 DIAGNOSIS — Z7189 Other specified counseling: Secondary | ICD-10-CM | POA: Diagnosis not present

## 2018-11-21 DIAGNOSIS — I10 Essential (primary) hypertension: Secondary | ICD-10-CM | POA: Diagnosis not present

## 2018-11-21 DIAGNOSIS — R002 Palpitations: Secondary | ICD-10-CM

## 2018-11-21 NOTE — Patient Instructions (Signed)
Medication Instructions:  Continue current medications If you need a refill on your cardiac medications before your next appointment, please call your pharmacy.   Lab work: NONE needed If you have labs (blood work) drawn today and your tests are completely normal, you will receive your results only by: Marland Kitchen MyChart Message (if you have MyChart) OR . A paper copy in the mail If you have any lab test that is abnormal or we need to change your treatment, we will call you to review the results.  Testing/Procedures: NONE needed  Follow-Up: At [facility], you and your health needs are our priority.  As part of our continuing mission to provide you with exceptional heart care, we have created designated Provider Care Teams.  These Care Teams include your primary Cardiologist (physician) and Advanced Practice Providers (APPs -  Physician Assistants and Nurse Practitioners) who all work together to provide you with the care you need, when you need it. You will need a follow up appointment in 12 months.  Please call our office 2 months in advance to schedule this appointment.  You may see Pixie Casino, MD or one of the following Advanced Practice Providers on your designated Care Team: [place], [place] . Fabian Sharp, PA-C

## 2018-11-21 NOTE — Progress Notes (Signed)
Virtual Visit via Video Note   This visit type was conducted due to national recommendations for restrictions regarding the COVID-19 Pandemic (e.g. social distancing) in an effort to limit this patient's exposure and mitigate transmission in our community.  Due to her co-morbid illnesses, this patient is at least at moderate risk for complications without adequate follow up.  This format is felt to be most appropriate for this patient at this time.  All issues noted in this document were discussed and addressed.  A limited physical exam was performed with this format.  Please refer to the patient's chart for her consent to telehealth for [facility]   Evaluation Performed:  Doximity video visit  Date:  11/21/2018   ID:  Anna Acevedo, DOB [birth year], MRN [id number]  Patient Location:  [place] [address]  Provider location:   [address], [address] [place], [place] [address]  PCP:  Denita Lung, MD  Cardiologist:  Pixie Casino, MD Electrophysiologist:  None   Chief Complaint:    History of Present Illness:    Anna Acevedo is a 84 y.o. female who presents via audio/video conferencing for a telehealth visit today.  Ms. Anna Acevedo is a pleasant 84 year old female patient who is the mother of 1 of my other patients.  She has a past medical history significant for dyslipidemia, hypertension and palpitations.  She is done well without any recent palpitations, worsening shortness of breath or chest pain.  Saw her PCP in the end of March who ordered repeat labs that showed mildly decreased vitamin D level, however her lipid profile had improved.  Her total cholesterol was 166, triglycerides 159, HDL 37 and LDL of 97.  This is improved from an LDL of 114 a year ago.  She is also had about 10 pounds weight loss although she does not report that she is eating less necessarily.  The patient does not have symptoms concerning for COVID-19 infection (fever,  chills, cough, or new SHORTNESS OF BREATH).    Prior CV studies:   The following studies were reviewed today:  Labwork Chart review  PMHx:  Past Medical History:  Diagnosis Date  . Colon polyp   . Diverticulosis   . Dyspnea   . GERD (gastroesophageal reflux disease)   . Hypercholesterolemia   . Hypertension    Essential  . Obesity   . Osteoporosis    OSTEOPENIA  . Palpitations   . Vitamin D deficiency     Past Surgical History:  Procedure Laterality Date  . CARDIOVASCULAR STRESS TEST  03-30-2006   EF 72%. Showed no evidence of ischemia   . COLONOSCOPY     gets every 5 years  . EYE SURGERY Bilateral 2014   Cataracts  . US ECHOCARDIOGRAPHY  September 2007   EF 55-60%. Showing  impaired diastolic relaxation and mild mitral regurgitation    FAMHx:  Family History  Problem Relation Age of Onset  . Hypertension Mother     SOCHx:   reports that she has never smoked. She has never used smokeless tobacco. She reports that she does not drink alcohol or use drugs.  ALLERGIES:  Allergies  Allergen Reactions  . Lisinopril     cough  . Penicillins Hives and Swelling  . Zocor [Simvastatin]     Muscle Pain  . Amoxicillin Rash  . Cephalexin Rash    MEDS:  Current Meds  Medication Sig  . acetaminophen (TYLENOL) 500 MG tablet Take 500 mg  by mouth every 6 (six) hours as needed for mild pain.   Marland Kitchen albuterol (PROVENTIL HFA;VENTOLIN HFA) 108 (90 Base) MCG/ACT inhaler Inhale 2 puffs into the lungs every 6 (six) hours as needed for wheezing or shortness of breath.  Marland Kitchen aspirin 81 MG tablet Take 81 mg by mouth daily.    Marland Kitchen CALCIUM PO Take 1 tablet by mouth daily. 600 plus d  . losartan-hydrochlorothiazide (HYZAAR) 50-12.5 MG tablet TAKE 1/2 TABLET BY MOUTH DAILY  . metoprolol tartrate (LOPRESSOR) 50 MG tablet Take 1 tablet (50 mg total) by mouth 2 (two) times daily.  . Multiple Vitamin (MULTIVITAMIN PO) Take 1 tablet by mouth daily. Reported on 11/02/2015  . pravastatin  (PRAVACHOL) 40 MG tablet Take 1 tablet (40 mg total) by mouth daily.     ROS: Pertinent items noted in HPI and remainder of comprehensive ROS otherwise negative.  Labs/Other Tests and Data Reviewed:    Recent Labs: 10/10/2018: ALT 14; BUN 15; Creatinine, Ser 0.88; Hemoglobin 14.1; Platelets 266; Potassium 4.5; Sodium 141   Recent Lipid Panel Lab Results  Component Value Date/Time   CHOL 166 10/10/2018 10:45 AM   TRIG 159 (H) 10/10/2018 10:45 AM   HDL 37 (L) 10/10/2018 10:45 AM   CHOLHDL 4.5 (H) 10/10/2018 10:45 AM   CHOLHDL 4.9 10/03/2016 10:11 AM   LDLCALC 97 10/10/2018 10:45 AM    Wt Readings from Last 3 Encounters:  11/21/18 175 lb (79.4 kg)  10/10/18 182 lb 3.2 oz (82.6 kg)  09/06/18 186 lb 6.4 oz (84.6 kg)     Exam:    Vital Signs:  BP (!) 165/83   Pulse 68   Ht 5\' 4"  (1.626 m)   Wt 175 lb (79.4 kg)   BMI 30.04 kg/m    General appearance: alert and no distress Lungs: no audible wheezing or visual breathing difficulty Extremities: extremities normal, atraumatic, no cyanosis or edema Skin: Skin color, texture, turgor normal. No rashes or lesions Neurologic: Mental status: Alert, oriented, thought content appropriate Psych: Pleasant  ASSESSMENT & PLAN:    1. Hypertension-controlled 2. Dyslipidemia on pravastatin 3. Obesity 4. History of palpitations-resolved  Mrs. Brand continues to do well with well-controlled blood pressure at home.  Her last in office reading was 128/80.  Her blood pressure today was elevated however I suspect this is due to whitecoat hypertension.  In general she says her blood pressure is more often in the 774J to 287 systolic.  Her cholesterol has improved and it may be attributable to dietary changes and weight loss.  She denies any recurrent palpitations.  No changes made to her medicines today.  COVID-19 Education: The signs and symptoms of COVID-19 were discussed with the patient and how to seek care for testing (follow up with PCP or  arrange E-visit).  The importance of social distancing was discussed today.  Patient Risk:   After full review of this patients clinical status, I feel that they are at least moderate risk at this time.  Time:   Today, I have spent 25 minutes with the patient with telehealth technology discussing cholesterol, weight management, palpitations.     Medication Adjustments/Labs and Tests Ordered: Current medicines are reviewed at length with the patient today.  Concerns regarding medicines are outlined above.   Tests Ordered: No orders of the defined types were placed in this encounter.   Medication Changes: No orders of the defined types were placed in this encounter.   Disposition:  in 1 year(s)  Pixie Casino,  MD, FACC, [place] Director of the Advanced Lipid Disorders &  Cardiovascular Risk Reduction Clinic Diplomate of the American Board of Clinical Lipidology Attending Cardiologist  Direct Dial: [phone]  Fax: [phone]  Website:  [email or web address]  Pixie Casino, MD  11/21/2018 1:28 PM

## 2019-03-04 ENCOUNTER — Other Ambulatory Visit: Payer: Self-pay

## 2019-03-04 NOTE — Patient Outreach (Signed)
[facility]) Care Management  03/04/2019  ANWAR CRILL [birth year] [id number]   Medication Adherence call to Mrs. Caragh Gasper Hippa Identifiers Verify spoke with patient she is past due on Pravastatin 40 mg patient explain she is taking 1 tablet daily and never missed a dose she has a pill box she set up every week.patient has medication or another month. Mrs. Crouse is showing past due under [place].   [place] Management Direct Dial [phone]  Fax [phone] [email or web address]

## 2019-03-21 ENCOUNTER — Other Ambulatory Visit: Payer: Self-pay

## 2019-03-21 NOTE — Patient Outreach (Signed)
[facility]) Care Management  03/21/2019  [name] 1935/04/15 [id number]   Medication Adherence call to Mrs. Anna Acevedo Hippa Identifiers Verify spoke with patient she is past due on Pravastatin 40 mg patient explain she takes 1 tablet daily patient has one more tablet patient ask if we can [facility]. Anna Acevedo is showing past due under [place].   [place] Management Direct Dial [phone]  Fax [phone] [email or web address]

## 2019-04-11 ENCOUNTER — Other Ambulatory Visit: Payer: Self-pay | Admitting: Internal Medicine

## 2019-09-09 ENCOUNTER — Ambulatory Visit: Payer: Self-pay | Admitting: Family Medicine

## 2019-09-10 DIAGNOSIS — H04123 Dry eye syndrome of bilateral lacrimal glands: Secondary | ICD-10-CM | POA: Diagnosis not present

## 2019-09-10 DIAGNOSIS — H35033 Hypertensive retinopathy, bilateral: Secondary | ICD-10-CM | POA: Diagnosis not present

## 2019-09-10 DIAGNOSIS — D23111 Other benign neoplasm of skin of right upper eyelid, including canthus: Secondary | ICD-10-CM | POA: Diagnosis not present

## 2019-09-19 ENCOUNTER — Ambulatory Visit: Payer: Medicare Other | Attending: Internal Medicine

## 2019-09-19 DIAGNOSIS — Z23 Encounter for immunization: Secondary | ICD-10-CM

## 2019-09-19 NOTE — Progress Notes (Signed)
   Covid-19 Vaccination Clinic  Name:  Anna Acevedo    MRN: [id number] DOB: [birth year]  09/19/2019  Ms. [name] was observed post Covid-19 immunization for 15 minutes without incident. She was provided with Vaccine Information Sheet and instruction to access the V-Safe system.   Ms. [name] was instructed to call 911 with any severe reactions post vaccine: Marland Kitchen Difficulty breathing  . Swelling of face and throat  . A fast heartbeat  . A bad rash all over body  . Dizziness and weakness   Immunizations Administered    Name Date Dose VIS Date Route   Pfizer COVID-19 Vaccine 09/19/2019  4:11 PM 0.3 mL 06/28/2019 Intramuscular   Manufacturer: [place]   Lot: [id number]   [place]: [id number]

## 2019-10-02 ENCOUNTER — Other Ambulatory Visit: Payer: Self-pay | Admitting: Internal Medicine

## 2019-10-14 ENCOUNTER — Ambulatory Visit (INDEPENDENT_AMBULATORY_CARE_PROVIDER_SITE_OTHER): Payer: Medicare Other | Admitting: Family Medicine

## 2019-10-14 ENCOUNTER — Other Ambulatory Visit: Payer: Self-pay

## 2019-10-14 ENCOUNTER — Encounter: Payer: Self-pay | Admitting: Family Medicine

## 2019-10-14 VITALS — BP 144/90 | HR 56 | Temp 97.5°F | Ht 62.5 in | Wt 179.8 lb

## 2019-10-14 DIAGNOSIS — Z0000 Encounter for general adult medical examination without abnormal findings: Secondary | ICD-10-CM | POA: Diagnosis not present

## 2019-10-14 DIAGNOSIS — M8580 Other specified disorders of bone density and structure, unspecified site: Secondary | ICD-10-CM

## 2019-10-14 DIAGNOSIS — E782 Mixed hyperlipidemia: Secondary | ICD-10-CM | POA: Diagnosis not present

## 2019-10-14 DIAGNOSIS — R002 Palpitations: Secondary | ICD-10-CM

## 2019-10-14 DIAGNOSIS — E559 Vitamin D deficiency, unspecified: Secondary | ICD-10-CM | POA: Diagnosis not present

## 2019-10-14 DIAGNOSIS — M8949 Other hypertrophic osteoarthropathy, multiple sites: Secondary | ICD-10-CM | POA: Diagnosis not present

## 2019-10-14 DIAGNOSIS — I10 Essential (primary) hypertension: Secondary | ICD-10-CM

## 2019-10-14 DIAGNOSIS — I119 Hypertensive heart disease without heart failure: Secondary | ICD-10-CM

## 2019-10-14 DIAGNOSIS — Z8639 Personal history of other endocrine, nutritional and metabolic disease: Secondary | ICD-10-CM | POA: Diagnosis not present

## 2019-10-14 DIAGNOSIS — M159 Polyosteoarthritis, unspecified: Secondary | ICD-10-CM

## 2019-10-14 NOTE — Progress Notes (Signed)
Anna Acevedo is a 85 y.o. female who presents for annual wellness visit,CPE and follow-up on chronic medical conditions.  She does have a history of hypertension with palpitations.  She does see cardiology periodically for that.  She is stable on losartan/HCTZ and metoprolol.  She continues on pravastatin for her lipids and is having no difficulty with that.  She does have a history of osteopenia as well as low vitamin D level.  She does have arthritis and does complain of some arthritic type pains in her hands.  She has no other concerns or complaints.  Social and family history was also reviewed.  Immunizations and Health Maintenance Immunization History  Administered Date(s) Administered  . DTaP 04/27/2004  . Influenza Whole 06/03/2011  . PFIZER SARS-COV-2 Vaccination 09/19/2019  . Pneumococcal Conjugate-13 05/23/2007  . Pneumococcal Polysaccharide-23 02/11/2013  . Tdap 07/19/2007, 10/03/2017  . Zoster 07/15/2008   Health Maintenance Due  Topic Date Due  . INFLUENZA VACCINE  02/16/2019    Last Pap smear: Aged out  Last mammogram: 05/27/13 Last colonoscopy:06/25/08 Last DEXA: 12/09/14 Dentist:Q six months Ophtho:yearly  Exercise: active   Other doctors caring for patient include: Dr. Debara Pickett cardio,  Advanced directives: Not on file ;copy asked for    Depression screen:  See questionnaire below.  Depression screen [facility] 2/9 10/14/2019 10/10/2018 10/03/2016 09/19/2011  Decreased Interest 0 0 0 0  Down, Depressed, Hopeless 0 0 0 0  PHQ - 2 Score 0 0 0 0    Fall Risk Screen: see questionnaire below. Fall Risk  10/14/2019 10/10/2018 10/03/2016  Falls in the past year? 0 0 No    ADL screen:  See questionnaire below Functional Status Survey: Is the patient deaf or have difficulty hearing?: No Does the patient have difficulty seeing, even when wearing glasses/contacts?: No Does the patient have difficulty concentrating, remembering, or making decisions?: No Does the patient have  difficulty walking or climbing stairs?: No Does the patient have difficulty dressing or bathing?: No Does the patient have difficulty doing errands alone such as visiting a doctor's office or shopping?: No   Review of Systems Constitutional: -, -unexpected weight change, -anorexia, -fatigue Allergy: -sneezing, -itching, -congestion Dermatology: denies changing moles, rash, lumps ENT: -runny nose, -ear pain, -sore throat,  Cardiology:  -chest pain, -palpitations, -orthopnea, Respiratory: -cough, -shortness of breath, -dyspnea on exertion, -wheezing,  Gastroenterology: -abdominal pain, -nausea, -vomiting, -diarrhea, -constipation, -dysphagia Hematology: -bleeding or bruising problems Musculoskeletal: -arthralgias, -myalgias, -joint swelling, -back pain, - Ophthalmology: -vision changes,  Urology: -dysuria, -difficulty urinating,  -urinary frequency, -urgency, incontinence Neurology: -, -numbness, , -memory loss, -falls, -dizziness    PHYSICAL EXAM:  BP (!) 144/90 (BP Location: Left Arm, Patient Position: Sitting)   Pulse (!) 56   Temp (!) 97.5 F (36.4 C)   Ht 5' 2.5" (1.588 m)   Wt 179 lb 12.8 oz (81.6 kg)   SpO2 94%   BMI 32.36 kg/m   General Appearance: Alert, cooperative, no distress, appears stated age Head: Normocephalic, without obvious abnormality, atraumatic Eyes: PERRL, conjunctiva/corneas clear, EOM's intact, fundi benign Ears: Normal TM's and external ear canals Nose: Nares normal, mucosa normal, no drainage or sinus tenderness Throat: Lips, mucosa, and tongue normal; teeth and gums normal Neck: Supple, no lymphadenopathy;  thyroid:  no enlargement/tenderness/nodules; no carotid bruit or JVD Lungs: Clear to auscultation bilaterally without wheezes, rales or ronchi; respirations unlabored Heart: Regular rate and rhythm, S1 and S2 normal, no murmur, rubor gallop Abdomen: Soft, non-tender, nondistended, normoactive bowel sounds,  no  masses, no  hepatosplenomegaly Skin:  Skin color, texture, turgor normal, no rashes or lesions Lymph nodes: Cervical, supraclavicular, and axillary nodes normal Neurologic:  CNII-XII intact, normal strength, sensation and gait; reflexes 2+ and symmetric throughout Psych: Normal mood, affect, hygiene and grooming.  ASSESSMENT/PLAN: Routine general medical examination at a health care facility - Plan: CBC with Differential/Platelet, Comprehensive metabolic panel, Lipid panel  Osteopenia, unspecified location - Plan: DG Bone Density  Mixed hyperlipidemia - Plan: Lipid panel  Benign essential hypertension  Benign hypertensive heart disease without heart failure - Plan: CBC with Differential/Platelet, Comprehensive metabolic panel  Primary osteoarthritis involving multiple joints  Palpitations  H/O vitamin D deficiency - Plan: Vitamin D, 25-hydroxy  She is to return here in 1 month for recheck on her blood pressure.  She will follow-up with cardiology on an as-needed basis.  Did recommend she get Shingrix.   healthy diet, including goals of calcium and vitamin D intake  Immunization recommendations discussed.  Colonoscopy recommendations reviewed   Medicare Attestation I have personally reviewed: The patient's medical and social history Their use of alcohol, tobacco or illicit drugs Their current medications and supplements The patient's functional ability including ADLs,fall risks, home safety risks, cognitive, and hearing and visual impairment Diet and physical activities Evidence for depression or mood disorders  The patient's weight, height, and BMI have been recorded in the chart.  I have made referrals, counseling, and provided education to the patient based on review of the above and I have provided the patient with a written personalized care plan for preventive services.     Jill Alexanders, MD   10/14/2019

## 2019-10-15 LAB — LIPID PANEL
Chol/HDL Ratio: 4.4 ratio (ref 0.0–4.4)
Cholesterol, Total: 164 mg/dL (ref 100–199)
HDL: 37 mg/dL — ABNORMAL LOW (ref >39)
LDL Chol Calc (NIH): 94 mg/dL (ref 0–99)
Triglycerides: 193 mg/dL — ABNORMAL HIGH (ref 0–149)
VLDL Cholesterol Cal: 33 mg/dL (ref 5–40)

## 2019-10-15 LAB — CBC WITH DIFFERENTIAL/PLATELET
Basophils Absolute: 0.1 10*3/uL (ref 0.0–0.2)
Basos: 1 %
EOS (ABSOLUTE): 0.5 10*3/uL — ABNORMAL HIGH (ref 0.0–0.4)
Eos: 5 %
Hematocrit: 43.6 % (ref 34.0–46.6)
Hemoglobin: 14.3 g/dL (ref 11.1–15.9)
Immature Grans (Abs): 0.1 10*3/uL (ref 0.0–0.1)
Immature Granulocytes: 1 %
Lymphocytes Absolute: 1.5 10*3/uL (ref 0.7–3.1)
Lymphs: 17 %
MCH: 27.4 pg (ref 26.6–33.0)
MCHC: 32.8 g/dL (ref 31.5–35.7)
MCV: 84 fL (ref 79–97)
Monocytes Absolute: 0.7 10*3/uL (ref 0.1–0.9)
Monocytes: 8 %
Neutrophils Absolute: 5.9 10*3/uL (ref 1.4–7.0)
Neutrophils: 68 %
Platelets: 214 10*3/uL (ref 150–450)
RBC: 5.22 x10E6/uL (ref 3.77–5.28)
RDW: 14.2 % (ref 11.7–15.4)
WBC: 8.7 10*3/uL (ref 3.4–10.8)

## 2019-10-15 LAB — VITAMIN D 25 HYDROXY (VIT D DEFICIENCY, FRACTURES): Vit D, 25-Hydroxy: 59.6 ng/mL (ref 30.0–100.0)

## 2019-10-15 LAB — COMPREHENSIVE METABOLIC PANEL
ALT: 12 IU/L (ref 0–32)
AST: 16 IU/L (ref 0–40)
Albumin/Globulin Ratio: 2 (ref 1.2–2.2)
Albumin: 4.5 g/dL (ref 3.6–4.6)
Alkaline Phosphatase: 79 IU/L (ref 39–117)
BUN/Creatinine Ratio: 12 (ref 12–28)
BUN: 12 mg/dL (ref 8–27)
Bilirubin Total: 0.4 mg/dL (ref 0.0–1.2)
CO2: 23 mmol/L (ref 20–29)
Calcium: 9.7 mg/dL (ref 8.7–10.3)
Chloride: 104 mmol/L (ref 96–106)
Creatinine, Ser: 0.99 mg/dL (ref 0.57–1.00)
GFR calc Af Amer: 61 mL/min/{1.73_m2} (ref >59)
GFR calc non Af Amer: 53 mL/min/{1.73_m2} — ABNORMAL LOW (ref >59)
Globulin, Total: 2.3 g/dL (ref 1.5–4.5)
Glucose: 103 mg/dL — ABNORMAL HIGH (ref 65–99)
Potassium: 4.4 mmol/L (ref 3.5–5.2)
Sodium: 142 mmol/L (ref 134–144)
Total Protein: 6.8 g/dL (ref 6.0–8.5)

## 2019-10-16 ENCOUNTER — Ambulatory Visit: Payer: Medicare Other | Attending: Internal Medicine

## 2019-10-16 DIAGNOSIS — Z23 Encounter for immunization: Secondary | ICD-10-CM

## 2019-10-16 NOTE — Progress Notes (Signed)
   Covid-19 Vaccination Clinic  Name:  Anna Acevedo    MRN: [id number] DOB: [birth year]  10/16/2019  Anna Acevedo was observed post Covid-19 immunization for 15 minutes without incident. She was provided with Vaccine Information Sheet and instruction to access the V-Safe system.   Anna Acevedo was instructed to call 911 with any severe reactions post vaccine: Marland Kitchen Difficulty breathing  . Swelling of face and throat  . A fast heartbeat  . A bad rash all over body  . Dizziness and weakness   Immunizations Administered    Name Date Dose VIS Date Route   Pfizer COVID-19 Vaccine 10/16/2019 12:45 PM 0.3 mL 06/28/2019 Intramuscular   Manufacturer: [place]   Lot: [id number]   [place]: [id number]

## 2019-10-18 ENCOUNTER — Other Ambulatory Visit: Payer: Self-pay | Admitting: Family Medicine

## 2019-10-18 DIAGNOSIS — M8580 Other specified disorders of bone density and structure, unspecified site: Secondary | ICD-10-CM

## 2019-10-23 ENCOUNTER — Other Ambulatory Visit: Payer: Self-pay | Admitting: Family Medicine

## 2019-10-23 DIAGNOSIS — I119 Hypertensive heart disease without heart failure: Secondary | ICD-10-CM

## 2019-11-12 ENCOUNTER — Encounter: Payer: Self-pay | Admitting: Family Medicine

## 2019-11-12 ENCOUNTER — Telehealth: Payer: Self-pay | Admitting: Family Medicine

## 2019-11-12 ENCOUNTER — Other Ambulatory Visit: Payer: Self-pay

## 2019-11-12 ENCOUNTER — Ambulatory Visit (INDEPENDENT_AMBULATORY_CARE_PROVIDER_SITE_OTHER): Payer: Medicare Other | Admitting: Family Medicine

## 2019-11-12 ENCOUNTER — Other Ambulatory Visit: Payer: Self-pay | Admitting: Medical

## 2019-11-12 VITALS — BP 134/82 | HR 64 | Temp 97.8°F | Wt 181.6 lb

## 2019-11-12 DIAGNOSIS — I10 Essential (primary) hypertension: Secondary | ICD-10-CM | POA: Diagnosis not present

## 2019-11-12 NOTE — Telephone Encounter (Signed)
Pt dropped off advanced directives.

## 2019-11-12 NOTE — Progress Notes (Signed)
   Subjective:    Patient ID: Anna Acevedo, female    DOB: 16-Sep-1934, 85 y.o.   MRN: [id number]  HPI She is here for recheck on her blood pressure.  On her last visit it was slightly elevated.   Review of Systems     Objective:   Physical Exam Alert and in no distress.  The blood pressure is recorded.       Assessment & Plan:  Benign essential hypertension She will continue on her present medication regimen.

## 2019-12-02 ENCOUNTER — Encounter: Payer: Self-pay | Admitting: Internal Medicine

## 2019-12-02 ENCOUNTER — Ambulatory Visit: Payer: Medicare Other | Admitting: Internal Medicine

## 2019-12-02 ENCOUNTER — Other Ambulatory Visit: Payer: Self-pay

## 2019-12-02 VITALS — BP 162/78 | HR 57 | Ht 63.5 in | Wt 180.8 lb

## 2019-12-02 DIAGNOSIS — E669 Obesity, unspecified: Secondary | ICD-10-CM | POA: Diagnosis not present

## 2019-12-02 DIAGNOSIS — E782 Mixed hyperlipidemia: Secondary | ICD-10-CM

## 2019-12-02 DIAGNOSIS — I10 Essential (primary) hypertension: Secondary | ICD-10-CM

## 2019-12-02 NOTE — Patient Instructions (Signed)

## 2019-12-02 NOTE — Progress Notes (Signed)
OFFICE NOTE  Chief Complaint:  No complaints  Primary Care Physician: Denita Lung, MD  HPI:  Anna Acevedo is a 86 y.o. female who is a former patient of Dr. Mare Ferrari. She has a history of hypertension, dyslipidemia, obesity and palpitations in the past. She initially saw Dr. Mare Ferrari 2007 and underwent stress testing at that time. Workup was negative and he instituted medical therapy. Since then she's done very well. He sees are basically annually until his retirement recently. Over the past year she has no new complaints. She denies shortness of breath or chest pain. She has no active palpitations. EKG shows sinus rhythm at 63 today. Blood pressure initially was elevated 162/80 however came and 134/78 at the end of the visit. Reports good sleep at night and energy level is reasonable during the day. She does have a follow-up with her primary care provider in a few weeks at which time she'll have routine blood work including a lipid profile.  11/10/2017  Anna Acevedo returns today for follow-up.  Overall she is doing well.  Her weight is up about 4 pounds.  She denies chest pain or worsening shortness of breath.  Blood pressure is reasonably well controlled.  EKG shows sinus bradycardia with poor R wave progression anteriorly at 56.  12/02/2019  Anna Acevedo is seen today for follow-up.  She is accompanied by her husband.  She denies any chest pain or shortness of breath.  Weight has increased a little.  Her diet is reflected in her lipid profile is total cholesterol is 164, triglycerides 192, HDL 37 LDL 94.  EKG shows sinus rhythm.  Blood pressure is a little elevated however she said at home was about 130/70.  PMHx:  Past Medical History:  Diagnosis Date  . Colon polyp   . Diverticulosis   . Dyspnea   . GERD (gastroesophageal reflux disease)   . Hypercholesterolemia   . Hypertension    Essential  . Obesity   . Osteoporosis    OSTEOPENIA  . Palpitations   . Vitamin D  deficiency     Past Surgical History:  Procedure Laterality Date  . CARDIOVASCULAR STRESS TEST  03-30-2006   EF 72%. Showed no evidence of ischemia   . COLONOSCOPY     gets every 5 years  . EYE SURGERY Bilateral 2014   Cataracts  . US ECHOCARDIOGRAPHY  September 2007   EF 55-60%. Showing  impaired diastolic relaxation and mild mitral regurgitation    FAMHx:  Family History  Problem Relation Age of Onset  . Hypertension Mother     SOCHx:   reports that she has never smoked. She has never used smokeless tobacco. She reports that she does not drink alcohol or use drugs.  ALLERGIES:  Allergies  Allergen Reactions  . Lisinopril     cough  . Penicillins Hives and Swelling  . Zocor [Simvastatin]     Muscle Pain  . Amoxicillin Rash  . Cephalexin Rash    ROS: Pertinent items noted in HPI and remainder of comprehensive ROS otherwise negative.  HOME MEDS: Current Outpatient Medications on File Prior to Visit  Medication Sig Dispense Refill  . acetaminophen (TYLENOL) 500 MG tablet Take 500 mg by mouth every 6 (six) hours as needed for mild pain.     Marland Kitchen aspirin 81 MG tablet Take 81 mg by mouth daily.      Marland Kitchen CALCIUM PO Take 1 tablet by mouth daily. 600 plus d    . losartan-hydrochlorothiazide (HYZAAR)  50-12.5 MG tablet Take 0.5 tablets by mouth daily. 45 tablet 1  . metoprolol tartrate (LOPRESSOR) 50 MG tablet TAKE 1 TABLET BY MOUTH TWICE A DAY 180 tablet 3  . Multiple Vitamin (MULTIVITAMIN PO) Take 1 tablet by mouth daily. Reported on 11/02/2015    . Multiple Vitamins-Minerals (ZINC PO) Take by mouth.    . pravastatin (PRAVACHOL) 40 MG tablet Take 1 tablet (40 mg total) by mouth daily. 90 tablet 3  . PROAIR HFA 108 (90 Base) MCG/ACT inhaler TAKE 2 PUFFS BY MOUTH EVERY 6 HOURS AS NEEDED FOR WHEEZE OR SHORTNESS OF BREATH 18 g 0  . VITAMIN D PO Take by mouth.     No current facility-administered medications on file prior to visit.    LABS/IMAGING: No results found for this or  any previous visit (from the past 48 hour(s)). No results found.  WEIGHTS: Wt Readings from Last 3 Encounters:  12/02/19 180 lb 12.8 oz (82 kg)  11/12/19 181 lb 9.6 oz (82.4 kg)  10/14/19 179 lb 12.8 oz (81.6 kg)    VITALS: BP (!) 162/78   Pulse (!) 57   Ht 5' 3.5" (1.613 m)   Wt 180 lb 12.8 oz (82 kg)   BMI 31.52 kg/m   EXAM: General appearance: alert and no distress Neck: no carotid bruit and no JVD Lungs: clear to auscultation bilaterally Heart: regular rate and rhythm Abdomen: soft, non-tender; bowel sounds normal; no masses,  no organomegaly Extremities: extremities normal, atraumatic, no cyanosis or edema Pulses: 2+ and symmetric Skin: Skin color, texture, turgor normal. No rashes or lesions Neurologic: Grossly normal Psych: Pleasant  EKG: Bradycardia 57-personally reviewed  ASSESSMENT: 1. Hypertension-controlled 2. Dyslipidemia on pravastatin (LDL <100) 3. Obesity 4. History of palpitations-resolved  PLAN: 1.   Mrs. Abrew continues to do well.  Have encouraged more physical activity and exercise.  Her triglycerides have run up some.  LDL remains at target below 100.  Blood pressure is well controlled at home.  No changes to her medicines today.  Follow-up with me annually or sooner as necessary.  Pixie Casino, MD, [facility], [place] Director of the Advanced Lipid Disorders &  Cardiovascular Risk Reduction Clinic Diplomate of the American Board of Clinical Lipidology Attending Cardiologist  Direct Dial: [phone]  Fax: [phone]  Website:  [email or web address] Deringer 12/02/2019, 9:19 AM

## 2019-12-03 ENCOUNTER — Ambulatory Visit
Admission: RE | Admit: 2019-12-03 | Discharge: 2019-12-03 | Disposition: A | Discharge status: Home / Self Care | Payer: Medicare Other | Source: Ambulatory Visit | Attending: Family Medicine | Admitting: Family Medicine

## 2019-12-03 DIAGNOSIS — M8580 Other specified disorders of bone density and structure, unspecified site: Secondary | ICD-10-CM

## 2019-12-03 DIAGNOSIS — Z780 Asymptomatic menopausal state: Secondary | ICD-10-CM | POA: Diagnosis not present

## 2019-12-03 DIAGNOSIS — M8589 Other specified disorders of bone density and structure, multiple sites: Secondary | ICD-10-CM | POA: Diagnosis not present

## 2019-12-30 ENCOUNTER — Other Ambulatory Visit: Payer: Self-pay | Admitting: Family Medicine

## 2019-12-30 DIAGNOSIS — E785 Hyperlipidemia, unspecified: Secondary | ICD-10-CM

## 2020-01-02 ENCOUNTER — Other Ambulatory Visit: Payer: Self-pay | Admitting: Internal Medicine

## 2020-01-30 DIAGNOSIS — L821 Other seborrheic keratosis: Secondary | ICD-10-CM | POA: Diagnosis not present

## 2020-01-30 DIAGNOSIS — L820 Inflamed seborrheic keratosis: Secondary | ICD-10-CM | POA: Diagnosis not present

## 2020-06-21 ENCOUNTER — Other Ambulatory Visit: Payer: Self-pay | Admitting: Family Medicine

## 2020-06-21 DIAGNOSIS — E785 Hyperlipidemia, unspecified: Secondary | ICD-10-CM

## 2020-06-30 ENCOUNTER — Ambulatory Visit
Admission: RE | Admit: 2020-06-30 | Discharge: 2020-06-30 | Disposition: A | Discharge status: Home / Self Care | Payer: Medicare Other | Source: Ambulatory Visit | Attending: Family Medicine | Admitting: Family Medicine

## 2020-06-30 ENCOUNTER — Ambulatory Visit (INDEPENDENT_AMBULATORY_CARE_PROVIDER_SITE_OTHER): Payer: Medicare Other | Admitting: Family Medicine

## 2020-06-30 ENCOUNTER — Other Ambulatory Visit: Payer: Self-pay

## 2020-06-30 ENCOUNTER — Encounter: Payer: Self-pay | Admitting: Family Medicine

## 2020-06-30 VITALS — BP 132/80 | HR 51 | Temp 97.1°F | Wt 181.8 lb

## 2020-06-30 DIAGNOSIS — M545 Low back pain, unspecified: Secondary | ICD-10-CM | POA: Diagnosis not present

## 2020-06-30 DIAGNOSIS — M5442 Lumbago with sciatica, left side: Secondary | ICD-10-CM | POA: Diagnosis not present

## 2020-06-30 NOTE — Patient Instructions (Signed)
Take 2 Aleve twice per day and if you need to you can also take 2 Tylenol 4 times per day as needed

## 2020-06-30 NOTE — Progress Notes (Signed)
   Subjective:    Patient ID: Anna Acevedo, female    DOB: [birth year], 85 y.o.   MRN: [id number]  HPI She complains of a 2-week history of left-sided low back pain with radiation down her leg mainly laterally.  No weakness, numbness or tingling.  No history of injury or problems prior to this of back pain.   Review of Systems     Objective:   Physical Exam Alert and complaining of left-sided back pain.  Good motion of her back with slight tenderness in the left lower lumbosacral area.  Hip motion is normal.  DTRs are 2+.  Straight leg raising positive at 45 degrees with positive sciatic stretch.  Motor function is normal.       Assessment & Plan:  Acute left-sided low back pain with left-sided sciatica - Plan: DG Lumbar Spine Complete Take 2 Aleve twice per day and if you need to you can also take 2 Tylenol 4 times per day as needed Return here in 2 weeks.

## 2020-07-14 ENCOUNTER — Encounter: Payer: Self-pay | Admitting: Family Medicine

## 2020-07-14 ENCOUNTER — Other Ambulatory Visit: Payer: Self-pay

## 2020-07-14 ENCOUNTER — Ambulatory Visit (INDEPENDENT_AMBULATORY_CARE_PROVIDER_SITE_OTHER): Payer: Medicare Other | Admitting: Family Medicine

## 2020-07-14 VITALS — BP 130/82 | HR 52 | Temp 98.0°F | Wt 180.6 lb

## 2020-07-14 DIAGNOSIS — M5442 Lumbago with sciatica, left side: Secondary | ICD-10-CM | POA: Diagnosis not present

## 2020-07-14 NOTE — Patient Instructions (Addendum)
Continue use the Tylenol which is 2 pills 4 times per day and alternate that with 3 ibuprofen 4 times per day

## 2020-07-14 NOTE — Progress Notes (Signed)
   Subjective:    Patient ID: Anna Acevedo, female    DOB: September 03, 1934, 85 y.o.   MRN: [id number]  HPI She is here for a recheck.  She states that she is roughly 50% better.  She has been taking 2 Tylenol 4 times per day but only 2 ibuprofen twice per day.  She states that Aleve does not work for her.   Review of Systems     Objective:   Physical Exam Alert and complaining of left-sided back pain.  Straight leg raising positive at 50 degrees with positive sciatic stretch  X-rays were reviewed with her.     Assessment & Plan:  Acute left-sided low back pain with left-sided sciatica Continue use the Tylenol which is 2 pills 4 times per day and alternate that with 3 ibuprofen 4 times per day Recheck here in 2 weeks.  Discussed further intervention including possible steroids but would like to avoid that and possible MRI to evaluate for need for further evaluation including epidurals.  She was comfortable with that.

## 2020-07-22 ENCOUNTER — Encounter: Payer: Self-pay | Admitting: Family Medicine

## 2020-07-22 ENCOUNTER — Ambulatory Visit (INDEPENDENT_AMBULATORY_CARE_PROVIDER_SITE_OTHER): Payer: Medicare Other | Admitting: Family Medicine

## 2020-07-22 ENCOUNTER — Other Ambulatory Visit: Payer: Self-pay

## 2020-07-22 VITALS — BP 142/78 | HR 51 | Temp 97.7°F | Wt 181.0 lb

## 2020-07-22 DIAGNOSIS — M5442 Lumbago with sciatica, left side: Secondary | ICD-10-CM | POA: Diagnosis not present

## 2020-07-22 MED ORDER — MELOXICAM 15 MG PO TABS: 15.0000 mg | ORAL_TABLET | Freq: Every day | ORAL | 0 refills | Status: DC

## 2020-07-22 NOTE — Patient Instructions (Signed)
Stop the ibuprofen and you can continue to take Tylenol 2 tablets 4 times per day.  Start taking the meloxicam daily. Call me and let me know how you are doing in a couple weeks.  As long as you keep improving #1 leave you alone.  Stay as active as you can be

## 2020-07-22 NOTE — Progress Notes (Signed)
   Subjective:    Patient ID: SHENG PRITZ, female    DOB: [birth year], 86 y.o.   MRN: [id number]  HPI She is here for recheck.  She and her husband did have confusion over her pain medications and proper regimen of taking it.  She states that she does not think she is any better but did sleep through the night.  Still complains of left hip and leg discomfort.   Review of Systems     Objective:   Physical Exam Exam of the left leg does show positive straight leg raising now at about 75 degrees.       Assessment & Plan:  Acute left-sided low back pain with left-sided sciatica - Plan: meloxicam (MOBIC) 15 MG tablet Stop the ibuprofen and you can continue to take Tylenol 2 tablets 4 times per day.  Start taking the meloxicam daily. Call me and let me know how you are doing in a couple weeks.  As long as you keep improving #1 leave you alone.  Stay as active as you can be They have questions concerning muscle relaxer.  Explained that that would probably not help much with this.  They are to call me in 2 weeks to let me know how they are doing.

## 2020-07-28 ENCOUNTER — Ambulatory Visit: Payer: Medicare Other | Admitting: Family Medicine

## 2020-08-02 ENCOUNTER — Other Ambulatory Visit: Payer: Self-pay | Admitting: Family Medicine

## 2020-08-02 DIAGNOSIS — M5442 Lumbago with sciatica, left side: Secondary | ICD-10-CM

## 2020-08-03 NOTE — Telephone Encounter (Signed)
[facility] is [facility] to fill pt mobic. Please  Advise [facility]

## 2020-08-14 ENCOUNTER — Other Ambulatory Visit: Payer: Self-pay | Admitting: Family Medicine

## 2020-08-14 DIAGNOSIS — I119 Hypertensive heart disease without heart failure: Secondary | ICD-10-CM

## 2020-08-14 DIAGNOSIS — M5442 Lumbago with sciatica, left side: Secondary | ICD-10-CM

## 2020-08-14 NOTE — Telephone Encounter (Signed)
[facility] to fill pt meloxicam . Please advise [facility] 

## 2020-08-29 ENCOUNTER — Other Ambulatory Visit: Payer: Self-pay | Admitting: Family Medicine

## 2020-08-29 DIAGNOSIS — M5442 Lumbago with sciatica, left side: Secondary | ICD-10-CM

## 2020-08-31 NOTE — Telephone Encounter (Signed)
[facility] to fill pt meloxicam . Please advise [facility] 

## 2020-09-11 ENCOUNTER — Emergency Department (HOSPITAL_COMMUNITY): Payer: Medicare Other

## 2020-09-11 ENCOUNTER — Observation Stay (HOSPITAL_COMMUNITY): Payer: Medicare Other

## 2020-09-11 ENCOUNTER — Observation Stay (HOSPITAL_COMMUNITY)
Admission: EM | Admit: 2020-09-11 | Discharge: 2020-09-12 | Disposition: A | Discharge status: Home / Self Care | Payer: Medicare Other | Attending: Family Medicine | Admitting: Family Medicine

## 2020-09-11 DIAGNOSIS — I10 Essential (primary) hypertension: Secondary | ICD-10-CM | POA: Diagnosis not present

## 2020-09-11 DIAGNOSIS — R4182 Altered mental status, unspecified: Principal | ICD-10-CM

## 2020-09-11 DIAGNOSIS — J449 Chronic obstructive pulmonary disease, unspecified: Secondary | ICD-10-CM | POA: Diagnosis not present

## 2020-09-11 DIAGNOSIS — Z79899 Other long term (current) drug therapy: Secondary | ICD-10-CM | POA: Diagnosis not present

## 2020-09-11 DIAGNOSIS — R0902 Hypoxemia: Secondary | ICD-10-CM | POA: Diagnosis not present

## 2020-09-11 DIAGNOSIS — E785 Hyperlipidemia, unspecified: Secondary | ICD-10-CM | POA: Diagnosis not present

## 2020-09-11 DIAGNOSIS — R918 Other nonspecific abnormal finding of lung field: Secondary | ICD-10-CM | POA: Diagnosis not present

## 2020-09-11 DIAGNOSIS — I6782 Cerebral ischemia: Secondary | ICD-10-CM | POA: Diagnosis not present

## 2020-09-11 DIAGNOSIS — R6889 Other general symptoms and signs: Secondary | ICD-10-CM | POA: Diagnosis not present

## 2020-09-11 DIAGNOSIS — Z7982 Long term (current) use of aspirin: Secondary | ICD-10-CM | POA: Diagnosis not present

## 2020-09-11 DIAGNOSIS — G9341 Metabolic encephalopathy: Secondary | ICD-10-CM | POA: Diagnosis not present

## 2020-09-11 DIAGNOSIS — M542 Cervicalgia: Secondary | ICD-10-CM | POA: Diagnosis not present

## 2020-09-11 DIAGNOSIS — G319 Degenerative disease of nervous system, unspecified: Secondary | ICD-10-CM | POA: Diagnosis not present

## 2020-09-11 DIAGNOSIS — R519 Headache, unspecified: Secondary | ICD-10-CM | POA: Diagnosis not present

## 2020-09-11 DIAGNOSIS — I639 Cerebral infarction, unspecified: Secondary | ICD-10-CM | POA: Diagnosis not present

## 2020-09-11 DIAGNOSIS — R29818 Other symptoms and signs involving the nervous system: Secondary | ICD-10-CM | POA: Diagnosis not present

## 2020-09-11 DIAGNOSIS — R8271 Bacteriuria: Secondary | ICD-10-CM | POA: Diagnosis not present

## 2020-09-11 DIAGNOSIS — Z20822 Contact with and (suspected) exposure to covid-19: Secondary | ICD-10-CM | POA: Diagnosis not present

## 2020-09-11 DIAGNOSIS — H748X1 Other specified disorders of right middle ear and mastoid: Secondary | ICD-10-CM | POA: Diagnosis not present

## 2020-09-11 DIAGNOSIS — Z743 Need for continuous supervision: Secondary | ICD-10-CM | POA: Diagnosis not present

## 2020-09-11 DIAGNOSIS — R911 Solitary pulmonary nodule: Secondary | ICD-10-CM | POA: Insufficient documentation

## 2020-09-11 DIAGNOSIS — T1490XA Injury, unspecified, initial encounter: Secondary | ICD-10-CM

## 2020-09-11 DIAGNOSIS — R410 Disorientation, unspecified: Secondary | ICD-10-CM | POA: Diagnosis not present

## 2020-09-11 DIAGNOSIS — R4781 Slurred speech: Secondary | ICD-10-CM | POA: Diagnosis not present

## 2020-09-11 DIAGNOSIS — R404 Transient alteration of awareness: Secondary | ICD-10-CM | POA: Diagnosis not present

## 2020-09-11 DIAGNOSIS — G459 Transient cerebral ischemic attack, unspecified: Secondary | ICD-10-CM | POA: Diagnosis not present

## 2020-09-11 LAB — COMPREHENSIVE METABOLIC PANEL
ALT: 60 U/L — ABNORMAL HIGH (ref 0–44)
AST: 26 U/L (ref 15–41)
Albumin: 3.8 g/dL (ref 3.5–5.0)
Alkaline Phosphatase: 122 U/L (ref 38–126)
Anion gap: 12 (ref 5–15)
BUN: 27 mg/dL — ABNORMAL HIGH (ref 8–23)
CO2: 25 mmol/L (ref 22–32)
Calcium: 9.5 mg/dL (ref 8.9–10.3)
Chloride: 99 mmol/L (ref 98–111)
Creatinine, Ser: 1.12 mg/dL — ABNORMAL HIGH (ref 0.44–1.00)
GFR, Estimated: 49 mL/min — ABNORMAL LOW (ref >60)
Glucose, Bld: 138 mg/dL — ABNORMAL HIGH (ref 70–99)
Potassium: 3.7 mmol/L (ref 3.5–5.1)
Sodium: 136 mmol/L (ref 135–145)
Total Bilirubin: 0.4 mg/dL (ref 0.3–1.2)
Total Protein: 7 g/dL (ref 6.5–8.1)

## 2020-09-11 LAB — URINALYSIS, ROUTINE W REFLEX MICROSCOPIC
Bacteria, UA: NONE SEEN
Bilirubin Urine: NEGATIVE
Glucose, UA: NEGATIVE mg/dL
Hgb urine dipstick: NEGATIVE
Ketones, ur: NEGATIVE mg/dL
Nitrite: NEGATIVE
Protein, ur: NEGATIVE mg/dL
Specific Gravity, Urine: 1.04 — ABNORMAL HIGH (ref 1.005–1.030)
pH: 5 (ref 5.0–8.0)

## 2020-09-11 LAB — I-STAT CHEM 8, ED
BUN: 34 mg/dL — ABNORMAL HIGH (ref 8–23)
Calcium, Ion: 1.17 mmol/L (ref 1.15–1.40)
Chloride: 103 mmol/L (ref 98–111)
Creatinine, Ser: 1 mg/dL (ref 0.44–1.00)
Glucose, Bld: 130 mg/dL — ABNORMAL HIGH (ref 70–99)
HCT: 40 % (ref 36.0–46.0)
Hemoglobin: 13.6 g/dL (ref 12.0–15.0)
Potassium: 4 mmol/L (ref 3.5–5.1)
Sodium: 139 mmol/L (ref 135–145)
TCO2: 26 mmol/L (ref 22–32)

## 2020-09-11 LAB — CBC WITH DIFFERENTIAL/PLATELET
Abs Immature Granulocytes: 0.13 10*3/uL — ABNORMAL HIGH (ref 0.00–0.07)
Basophils Absolute: 0.1 10*3/uL (ref 0.0–0.1)
Basophils Relative: 0 %
Eosinophils Absolute: 0.3 10*3/uL (ref 0.0–0.5)
Eosinophils Relative: 2 %
HCT: 40.9 % (ref 36.0–46.0)
Hemoglobin: 12.7 g/dL (ref 12.0–15.0)
Immature Granulocytes: 1 %
Lymphocytes Relative: 11 %
Lymphs Abs: 1.3 10*3/uL (ref 0.7–4.0)
MCH: 26.7 pg (ref 26.0–34.0)
MCHC: 31.1 g/dL (ref 30.0–36.0)
MCV: 85.9 fL (ref 80.0–100.0)
Monocytes Absolute: 0.8 10*3/uL (ref 0.1–1.0)
Monocytes Relative: 7 %
Neutro Abs: 8.7 10*3/uL — ABNORMAL HIGH (ref 1.7–7.7)
Neutrophils Relative %: 79 %
Platelets: 216 10*3/uL (ref 150–400)
RBC: 4.76 MIL/uL (ref 3.87–5.11)
RDW: 15.6 % — ABNORMAL HIGH (ref 11.5–15.5)
WBC: 11.2 10*3/uL — ABNORMAL HIGH (ref 4.0–10.5)
nRBC: 0 % (ref 0.0–0.2)

## 2020-09-11 LAB — LACTIC ACID, PLASMA: Lactic Acid, Venous: 1.7 mmol/L (ref 0.5–1.9)

## 2020-09-11 LAB — AMMONIA: Ammonia: 31 umol/L (ref 9–35)

## 2020-09-11 LAB — PROTIME-INR
INR: 1 (ref 0.8–1.2)
Prothrombin Time: 12.7 seconds (ref 11.4–15.2)

## 2020-09-11 LAB — RESP PANEL BY RT-PCR (FLU A&B, COVID) ARPGX2
Influenza A by PCR: NEGATIVE
Influenza B by PCR: NEGATIVE
SARS Coronavirus 2 by RT PCR: NEGATIVE

## 2020-09-11 LAB — APTT: aPTT: 30 seconds (ref 24–36)

## 2020-09-11 MED ORDER — IOHEXOL 350 MG/ML SOLN
75.0000 mL | Freq: Once | INTRAVENOUS | Status: AC | PRN
  Administered 2020-09-11: 75 mL via INTRAVENOUS

## 2020-09-11 MED ORDER — LOSARTAN POTASSIUM 50 MG PO TABS: 50.0000 mg | ORAL_TABLET | Freq: Every day | ORAL | Status: DC

## 2020-09-11 MED ORDER — IBUPROFEN 200 MG PO TABS: 200.0000 mg | ORAL_TABLET | Freq: Four times a day (QID) | ORAL | Status: DC | PRN

## 2020-09-11 MED ORDER — SODIUM CHLORIDE 0.9 % IV SOLN: INTRAVENOUS | Status: AC

## 2020-09-11 MED ORDER — LOSARTAN POTASSIUM 25 MG PO TABS
25.0000 mg | ORAL_TABLET | Freq: Every day | ORAL | Status: DC
Start: 2020-09-12 — End: 2020-09-12
  Administered 2020-09-12: 25 mg via ORAL
  Filled 2020-09-11: qty 1

## 2020-09-11 MED ORDER — ENOXAPARIN SODIUM 40 MG/0.4ML ~~LOC~~ SOLN
40.0000 mg | SUBCUTANEOUS | Status: DC
  Administered 2020-09-11: 40 mg via SUBCUTANEOUS
  Filled 2020-09-11: qty 0.4

## 2020-09-11 MED ORDER — SENNOSIDES-DOCUSATE SODIUM 8.6-50 MG PO TABS: 1.0000 | ORAL_TABLET | Freq: Every evening | ORAL | Status: DC | PRN

## 2020-09-11 MED ORDER — ACETAMINOPHEN 500 MG PO TABS
1000.0000 mg | ORAL_TABLET | Freq: Once | ORAL | Status: AC
  Administered 2020-09-11: 1000 mg via ORAL
  Filled 2020-09-11: qty 2

## 2020-09-11 MED ORDER — LOSARTAN POTASSIUM-HCTZ 50-12.5 MG PO TABS: 0.5000 | ORAL_TABLET | Freq: Every day | ORAL | Status: DC

## 2020-09-11 MED ORDER — METOPROLOL TARTRATE 50 MG PO TABS
50.0000 mg | ORAL_TABLET | Freq: Two times a day (BID) | ORAL | Status: DC
  Administered 2020-09-12 (×2): 50 mg via ORAL
  Filled 2020-09-11 (×2): qty 1

## 2020-09-11 MED ORDER — SODIUM CHLORIDE 0.9 % IV BOLUS
500.0000 mL | Freq: Once | INTRAVENOUS | Status: AC
  Administered 2020-09-11: 500 mL via INTRAVENOUS

## 2020-09-11 MED ORDER — ACETAMINOPHEN 500 MG PO TABS
500.0000 mg | ORAL_TABLET | Freq: Four times a day (QID) | ORAL | Status: DC | PRN
  Administered 2020-09-12: 500 mg via ORAL
  Filled 2020-09-11: qty 1

## 2020-09-11 MED ORDER — LIDOCAINE 5 % EX PTCH
1.0000 | MEDICATED_PATCH | CUTANEOUS | Status: DC
  Administered 2020-09-11: 1 via TRANSDERMAL
  Filled 2020-09-11: qty 1

## 2020-09-11 MED ORDER — ALBUTEROL SULFATE HFA 108 (90 BASE) MCG/ACT IN AERS: 1.0000 | INHALATION_SPRAY | Freq: Four times a day (QID) | RESPIRATORY_TRACT | Status: DC | PRN

## 2020-09-11 MED ORDER — STROKE: EARLY STAGES OF RECOVERY BOOK: Freq: Once | Status: DC

## 2020-09-11 MED ORDER — HYDROCHLOROTHIAZIDE 12.5 MG PO CAPS: 12.5000 mg | ORAL_CAPSULE | Freq: Every day | ORAL | Status: DC

## 2020-09-11 MED ORDER — HYDROCHLOROTHIAZIDE 10 MG/ML ORAL SUSPENSION
6.2500 mg | Freq: Every day | ORAL | Status: DC
Start: 2020-09-12 — End: 2020-09-12
  Filled 2020-09-11 (×2): qty 1.25

## 2020-09-11 MED ORDER — PRAVASTATIN SODIUM 40 MG PO TABS
40.0000 mg | ORAL_TABLET | Freq: Every day | ORAL | Status: DC
  Administered 2020-09-12: 40 mg via ORAL
  Filled 2020-09-11: qty 1

## 2020-09-11 MED ORDER — ASPIRIN EC 81 MG PO TBEC
81.0000 mg | DELAYED_RELEASE_TABLET | Freq: Every day | ORAL | Status: DC
  Administered 2020-09-12: 81 mg via ORAL
  Filled 2020-09-11: qty 1

## 2020-09-11 MED ORDER — MAGIC MOUTHWASH W/LIDOCAINE
10.0000 mL | Freq: Four times a day (QID) | ORAL | Status: DC | PRN
  Filled 2020-09-11: qty 10

## 2020-09-11 NOTE — ED Notes (Signed)
Patient transported to MRI 

## 2020-09-11 NOTE — ED Provider Notes (Signed)
[facility] EMERGENCY DEPARTMENT Provider Note   CSN: [id number] Arrival date & time: 09/11/20  5621     History Chief Complaint  Patient presents with  . Altered Mental Status    Anna Acevedo is a 86 y.o. female.  Anna Acevedo is a 86 y.o. female with history of hypertension, hyperlipidemia, obesity, osteoporosis, GERD, who presents to the emergency department via EMS for evaluation of altered mental status.  Per husband patient went to bed around 11 PM in her usual state of health, did not have any confusion yesterday.  Husband reports that around 2:30 AM he woke up to find her feeling around over the covers she then got up and was feeling around for things in the room which is very atypical for her.  He tried to get her back to bed she seemed to have trouble following commands and understanding what he was asking her to do.  She was able to lay on the very edge of the bed but would not roll over or scoot closer to the bed so he sat in a chair next to the bed to keep her from falling off.  Around 3:30-4 AM she got up again, walked into their computer room and was acting like she was typing on the computer, then got up to go to the bathroom, left the restroom without flushing the toilet and with the lights still on, which is very abnormal.  After this is second episode she dropped her patient's husband called her children who came over and they decided to call EMS.  When EMS arrived patient reported a posterior headache.  She also reports some mild nausea.  Denies any vision changes, has not felt dizzy or off balance.  Denies any chest pain or shortness of breath.  No abdominal pain.  She is able to tell me her first name but has difficulty providing her last name.  Is unable to identify her husband in the room, but instead states her own name.  She knows that she is at hospital, but is not oriented to time.  No recent falls or head injury.  No prior history of similar  symptoms.  Patient did recently have a tooth extracted on the left lower jaw on Wednesday, no new medications before or after this procedure.  Reports some soreness and swelling in this area but no complications.        Past Medical History:  Diagnosis Date  . Colon polyp   . Diverticulosis   . Dyspnea   . GERD (gastroesophageal reflux disease)   . Hypercholesterolemia   . Hypertension    Essential  . Obesity   . Osteoporosis    OSTEOPENIA  . Palpitations   . Vitamin D deficiency     Patient Active Problem List   Diagnosis Date Noted  . Benign essential hypertension 11/12/2017  . Osteopenia 09/19/2011  . Osteoarthritis 08/16/2011  . Benign hypertensive heart disease without heart failure 02/17/2011  . Hyperlipidemia 02/17/2011  . Palpitations 02/17/2011    Past Surgical History:  Procedure Laterality Date  . CARDIOVASCULAR STRESS TEST  03-30-2006   EF 72%. Showed no evidence of ischemia   . COLONOSCOPY     gets every 5 years  . EYE SURGERY Bilateral 2014   Cataracts  . US ECHOCARDIOGRAPHY  September 2007   EF 55-60%. Showing  impaired diastolic relaxation and mild mitral regurgitation     OB History    Gravida  2  Para  2   Term  2   Preterm      AB      Living  2     SAB      IAB      Ectopic      Multiple      Live Births  2           Family History  Problem Relation Age of Onset  . Hypertension Mother     Social History   Tobacco Use  . Smoking status: Never Smoker  . Smokeless tobacco: Never Used  Substance Use Topics  . Alcohol use: No    Alcohol/week: 0.0 standard drinks  . Drug use: No    Home Medications Prior to Admission medications   Medication Sig Start Date End Date Taking? Authorizing Provider  meloxicam (MOBIC) 15 MG tablet TAKE 1 TABLET (15 MG TOTAL) BY MOUTH DAILY. 08/31/20   Denita Lung, MD  acetaminophen (TYLENOL) 500 MG tablet Take 500 mg by mouth every 6 (six) hours as needed for mild pain.      [provider]  aspirin 81 MG tablet Take 81 mg by mouth daily.    [provider]  CALCIUM PO Take 1 tablet by mouth daily. 600 plus d    [provider]  ibuprofen (ADVIL) 200 MG tablet Take 200 mg by mouth every 6 (six) hours as needed.    [provider]  losartan-hydrochlorothiazide (HYZAAR) 50-12.5 MG tablet TAKE 1/2 TABLET BY MOUTH EVERY DAY 01/02/20   Hilty, Nadean Corwin, MD  metoprolol tartrate (LOPRESSOR) 50 MG tablet TAKE 1 TABLET BY MOUTH TWICE A DAY 08/14/20   Denita Lung, MD  Multiple Vitamin (MULTIVITAMIN PO) Take 1 tablet by mouth daily. Reported on 11/02/2015    [provider]  Multiple Vitamins-Minerals (ZINC PO) Take by mouth.    [provider]  pravastatin (PRAVACHOL) 40 MG tablet TAKE 1 TABLET BY MOUTH EVERY DAY 06/22/20   Denita Lung, MD  PROAIR HFA 108 [phone] Base) MCG/ACT inhaler TAKE 2 PUFFS BY MOUTH EVERY 6 HOURS AS NEEDED FOR WHEEZE OR SHORTNESS OF BREATH 11/12/19   Denita Lung, MD  VITAMIN D PO Take by mouth.    [provider]    Allergies    Lisinopril, Penicillins, Zocor [simvastatin], Amoxicillin, and Cephalexin  Review of Systems   Review of Systems  Constitutional: Negative for chills and fever.  HENT: Negative.   Eyes: Negative for visual disturbance.  Respiratory: Negative for cough and shortness of breath.   Cardiovascular: Negative for chest pain.  Gastrointestinal: Positive for nausea. Negative for abdominal pain, constipation, diarrhea and vomiting.  Genitourinary: Negative for dysuria.  Musculoskeletal: Negative for arthralgias and myalgias.  Skin: Negative for color change and rash.  Neurological: Positive for headaches. Negative for dizziness, seizures, syncope, facial asymmetry, speech difficulty, weakness, light-headedness and numbness.  Psychiatric/Behavioral: Positive for confusion.  All other systems reviewed and are negative.   Physical Exam Updated Vital Signs BP  113/75 (BP Location: Right Arm)   Pulse 69   Temp 98.8 F (37.1 C) (Oral)   Resp 16   SpO2 95%   Physical Exam Vitals and nursing note reviewed.  Constitutional:      General: She is not in acute distress.    Appearance: Normal appearance. She is well-developed and well-nourished. She is not diaphoretic.     Comments: Alert, confused, chronically ill-appearing but in no acute distress  HENT:  Head: Normocephalic and atraumatic.     Mouth/Throat:     Mouth: Oropharynx is clear and moist. Mucous membranes are moist.     Pharynx: Oropharynx is clear.     Comments: Moist and clear with some swelling to the left lower jaw where patient had a recent extraction, no bleeding, small bruise on the face in this area Eyes:     General:        Right eye: No discharge.        Left eye: No discharge.     Extraocular Movements: Extraocular movements intact and EOM normal.     Pupils: Pupils are equal, round, and reactive to light.     Comments: No nystagmus  Cardiovascular:     Rate and Rhythm: Normal rate and regular rhythm.     Pulses: Intact distal pulses.     Heart sounds: Normal heart sounds. No murmur heard. No friction rub. No gallop.   Pulmonary:     Effort: Pulmonary effort is normal. No respiratory distress.     Breath sounds: Normal breath sounds. No wheezing or rales.     Comments: Respirations equal and unlabored, patient able to speak in full sentences, lungs clear to auscultation bilaterally  Abdominal:     General: Bowel sounds are normal. There is no distension.     Palpations: Abdomen is soft. There is no mass.     Tenderness: There is no abdominal tenderness. There is no guarding.     Comments: Abdomen soft, nondistended, nontender to palpation in all quadrants without guarding or peritoneal signs   Musculoskeletal:        General: No deformity or edema.     Cervical back: Normal range of motion and neck supple. No rigidity.  Skin:    General: Skin is warm and dry.      Capillary Refill: Capillary refill takes less than 2 seconds.  Neurological:     Mental Status: She is alert.     Coordination: Coordination normal.     Comments: Speech is clear, but patient has a lot of difficulty following commands and sometimes mixes up words Oriented to self, but unable to identify her husband, oriented to place but not time CN III-XII intact Normal strength in upper and lower extremities bilaterally including dorsiflexion and plantar flexion, strong and equal grip strength Sensation normal to light and sharp touch Moves extremities without ataxia, patient with difficulty completing finger-to-nose, despite repetitive instruction   Psychiatric:        Mood and Affect: Mood normal.        Behavior: Behavior normal.     ED Results / Procedures / Treatments   Labs (all labs ordered are listed, but only abnormal results are displayed) Labs Reviewed  COMPREHENSIVE METABOLIC PANEL - Abnormal; Notable for the following components:      Result Value   Glucose, Bld 138 (*)    BUN 27 (*)    Creatinine, Ser 1.12 (*)    ALT 60 (*)    GFR, Estimated 49 (*)    All other components within normal limits  CBC WITH DIFFERENTIAL/PLATELET - Abnormal; Notable for the following components:   WBC 11.2 (*)    RDW 15.6 (*)    Neutro Abs 8.7 (*)    Abs Immature Granulocytes 0.13 (*)    All other components within normal limits  I-STAT CHEM 8, ED - Abnormal; Notable for the following components:   BUN 34 (*)    Glucose, Bld 130 (*)  All other components within normal limits  URINE CULTURE  RESP PANEL BY RT-PCR (FLU A&B, COVID) ARPGX2  PROTIME-INR  LACTIC ACID, PLASMA  APTT  AMMONIA  LACTIC ACID, PLASMA  URINALYSIS, ROUTINE W REFLEX MICROSCOPIC    EKG EKG Interpretation  Date/Time:  Friday September 11 2020 06:41:24 EST Ventricular Rate:  72 PR Interval:    QRS Duration: 95 QT Interval:  437 QTC Calculation: 479 R Axis:   2 Text Interpretation: Sinus rhythm  Borderline T abnormalities, anterior leads No old tracing to compare Confirmed by Sherwood Gambler [phone]) on 09/11/2020 6:58:58 AM   Radiology CT Angio Head W or Wo Contrast  Result Date: 09/11/2020 CLINICAL DATA:  Stroke/TIA, assess intracranial arteries. Assess extracranial arteries. EXAM: CT ANGIOGRAPHY HEAD AND NECK TECHNIQUE: Multidetector CT imaging of the head and neck was performed using the standard protocol during bolus administration of intravenous contrast. Multiplanar CT image reconstructions and MIPs were obtained to evaluate the vascular anatomy. Carotid stenosis measurements (when applicable) are obtained utilizing NASCET criteria, using the distal internal carotid diameter as the denominator. CONTRAST:  60mL OMNIPAQUE IOHEXOL 350 MG/ML SOLN COMPARISON:  Noncontrast head CT performed earlier today 09/11/2020. FINDINGS: CTA NECK FINDINGS Aortic arch: Common origin of the innominate and left common carotid arteries. Atherosclerotic plaque within the visualized aortic arch and proximal major branch vessels of the neck. No hemodynamically significant innominate or proximal subclavian artery stenosis. Right carotid system: CCA and ICA patent within the neck without significant stenosis (50% or greater). Mild soft and calcified plaque within the carotid bifurcation and proximal ICA. Left carotid system: CCA and ICA patent within the neck without significant stenosis (50% or greater). Mild soft and calcified plaque within the carotid bifurcation and proximal ICA. Vertebral arteries: The right vertebral artery is developmentally diminutive, but patent throughout the neck. Left vertebral artery strongly dominant and patent throughout the neck. 60% narrowing of the left vertebral artery at C5 level due to mass effect from cervical spine osteophytes. Skeleton: Please refer to separately reported CT of the cervical spine for cervical spine findings. No acute bony abnormality or aggressive osseous lesion  elsewhere. Other neck: No neck mass or cervical lymphadenopathy. Thyroid unremarkable. Upper chest: Mild patchy ground-glass opacity within the right lung apex. 7 mm nodule within the right lung apex (series 7, image 328). Review of the MIP images confirms the above findings CTA HEAD FINDINGS There is dolichoectasia of the anterior and posterior circulation. Anterior circulation: The intracranial internal carotid arteries are patent. The M1 middle cerebral arteries are patent. No M2 proximal branch occlusion or high-grade proximal stenosis is identified. The anterior cerebral arteries are patent. No intracranial aneurysm is identified. Posterior circulation: The intracranial right vertebral artery is developmentally diminutive, but patent. Superimposed high-grade stenosis within the proximal V4 right vertebral artery. The dominant intracranial left vertebral artery is patent with minimal nonstenotic calcified plaque. The basilar artery is patent. The posterior cerebral arteries are patent. Sizable right posterior communicating artery. A small left posterior communicating artery is also present. Venous sinuses: Within the limitations of contrast timing, no convincing thrombus. Anatomic variants: None significant Review of the MIP images confirms the above findings IMPRESSION: CTA neck: 1. Common and internal carotid arteries patent within the neck without hemodynamically significant stenosis. Mild soft and calcified plaque within the carotid bifurcations and proximal ICAs bilaterally. 2. Vertebral arteries patent within the neck. The left vertebral artery is strongly dominant. 60% narrowing of the left vertebral artery at the C5 level due to mass effect from  cervical spine osteophytes. 3. Mild patchy ground-glass opacity within the right lung apex. This finding is nonspecific but may reflect edema or infection. Clinical correlation is recommended. 4. 7 mm right upper lobe pulmonary nodule. Non-contrast chest CT at  6-12 months is recommended. If the nodule is stable at time of repeat CT, then future CT at 18-24 months (from today's scan) is considered optional for low-risk patients, but is recommended for high-risk patients. This recommendation follows the consensus statement: Guidelines for Management of Incidental Pulmonary Nodules Detected on CT Images: From the Fleischner Society 2017; Radiology 2017; 284:228-243. CTA head: 1. The right vertebral artery remains developmentally diminutive intracranially. Superimposed severe focal stenosis within the proximal V4 segment of this vessel. 2. Elsewhere, no intracranial large vessel occlusion or proximal high-grade arterial stenosis. 3. Dolichoectasia of the intracranial anterior and posterior circulation suggestive of longstanding hypertension. Electronically Signed   By: Kellie Simmering DO   On: 09/11/2020 10:47   CT Head Wo Contrast  Result Date: 09/11/2020 CLINICAL DATA:  86 year old female who woke at 2 a.m. with confusion. Headache. EXAM: CT HEAD WITHOUT CONTRAST TECHNIQUE: Contiguous axial images were obtained from the base of the skull through the vertex without intravenous contrast. COMPARISON:  Report of head CT 09/06/2010 (no images available). FINDINGS: Brain: Faint fairly symmetric dystrophic calcifications in the bilateral basal ganglia. Confluent bilateral cerebral white matter hypodensity. No midline shift, ventriculomegaly, mass effect, evidence of mass lesion, intracranial hemorrhage or evidence of cortically based acute infarction. No cortical encephalomalacia identified. Vascular: Calcified atherosclerosis at the skull base. Generalized arterial ectasia, including of the dominant left vertebral artery. No suspicious intracranial vascular hyperdensity. Skull: No acute osseous abnormality identified. Sinuses/Orbits: Minimal to mild maxillary sinus mucosal thickening greater on the left. Other visualized paranasal sinuses and mastoids are clear. Tympanic  cavities are clear. Other: Leftward gaze deviation. No other acute orbit or scalp soft tissue finding. IMPRESSION: 1. No acute intracranial abnormality. 2. Advanced cerebral white matter disease, most commonly due to chronic small vessel ischemia. 3. Generalized intracranial artery tortuosity, calcified atherosclerosis. Electronically Signed   By: Genevie Ann M.D.   On: 09/11/2020 07:47   CT Angio Neck W and/or Wo Contrast  Result Date: 09/11/2020 CLINICAL DATA:  Stroke/TIA, assess intracranial arteries. Assess extracranial arteries. EXAM: CT ANGIOGRAPHY HEAD AND NECK TECHNIQUE: Multidetector CT imaging of the head and neck was performed using the standard protocol during bolus administration of intravenous contrast. Multiplanar CT image reconstructions and MIPs were obtained to evaluate the vascular anatomy. Carotid stenosis measurements (when applicable) are obtained utilizing NASCET criteria, using the distal internal carotid diameter as the denominator. CONTRAST:  11mL OMNIPAQUE IOHEXOL 350 MG/ML SOLN COMPARISON:  Noncontrast head CT performed earlier today 09/11/2020. FINDINGS: CTA NECK FINDINGS Aortic arch: Common origin of the innominate and left common carotid arteries. Atherosclerotic plaque within the visualized aortic arch and proximal major branch vessels of the neck. No hemodynamically significant innominate or proximal subclavian artery stenosis. Right carotid system: CCA and ICA patent within the neck without significant stenosis (50% or greater). Mild soft and calcified plaque within the carotid bifurcation and proximal ICA. Left carotid system: CCA and ICA patent within the neck without significant stenosis (50% or greater). Mild soft and calcified plaque within the carotid bifurcation and proximal ICA. Vertebral arteries: The right vertebral artery is developmentally diminutive, but patent throughout the neck. Left vertebral artery strongly dominant and patent throughout the neck. 60% narrowing of  the left vertebral artery at C5 level due to mass effect  from cervical spine osteophytes. Skeleton: Please refer to separately reported CT of the cervical spine for cervical spine findings. No acute bony abnormality or aggressive osseous lesion elsewhere. Other neck: No neck mass or cervical lymphadenopathy. Thyroid unremarkable. Upper chest: Mild patchy ground-glass opacity within the right lung apex. 7 mm nodule within the right lung apex (series 7, image 328). Review of the MIP images confirms the above findings CTA HEAD FINDINGS There is dolichoectasia of the anterior and posterior circulation. Anterior circulation: The intracranial internal carotid arteries are patent. The M1 middle cerebral arteries are patent. No M2 proximal branch occlusion or high-grade proximal stenosis is identified. The anterior cerebral arteries are patent. No intracranial aneurysm is identified. Posterior circulation: The intracranial right vertebral artery is developmentally diminutive, but patent. Superimposed high-grade stenosis within the proximal V4 right vertebral artery. The dominant intracranial left vertebral artery is patent with minimal nonstenotic calcified plaque. The basilar artery is patent. The posterior cerebral arteries are patent. Sizable right posterior communicating artery. A small left posterior communicating artery is also present. Venous sinuses: Within the limitations of contrast timing, no convincing thrombus. Anatomic variants: None significant Review of the MIP images confirms the above findings IMPRESSION: CTA neck: 1. Common and internal carotid arteries patent within the neck without hemodynamically significant stenosis. Mild soft and calcified plaque within the carotid bifurcations and proximal ICAs bilaterally. 2. Vertebral arteries patent within the neck. The left vertebral artery is strongly dominant. 60% narrowing of the left vertebral artery at the C5 level due to mass effect from cervical spine  osteophytes. 3. Mild patchy ground-glass opacity within the right lung apex. This finding is nonspecific but may reflect edema or infection. Clinical correlation is recommended. 4. 7 mm right upper lobe pulmonary nodule. Non-contrast chest CT at 6-12 months is recommended. If the nodule is stable at time of repeat CT, then future CT at 18-24 months (from today's scan) is considered optional for low-risk patients, but is recommended for high-risk patients. This recommendation follows the consensus statement: Guidelines for Management of Incidental Pulmonary Nodules Detected on CT Images: From the Fleischner Society 2017; Radiology 2017; 284:228-243. CTA head: 1. The right vertebral artery remains developmentally diminutive intracranially. Superimposed severe focal stenosis within the proximal V4 segment of this vessel. 2. Elsewhere, no intracranial large vessel occlusion or proximal high-grade arterial stenosis. 3. Dolichoectasia of the intracranial anterior and posterior circulation suggestive of longstanding hypertension. Electronically Signed   By: Kellie Simmering DO   On: 09/11/2020 10:47   CT C-SPINE NO CHARGE  Result Date: 09/11/2020 CLINICAL DATA:  Cervicalgia EXAM: CT CERVICAL SPINE WITHOUT CONTRAST TECHNIQUE: Multidetector CT imaging of the cervical spine was performed without intravenous contrast. Multiplanar CT image reconstructions were also generated. COMPARISON:  CT angiogram neck September 12, 2019; cervical spine CT September 06, 2010 FINDINGS: Alignment: There is stable 1 mm of anterolisthesis of C4 on C5. No other spondylolisthesis evident. Skull base and vertebrae: Skull base and craniocervical junction regions appear normal. There is no appreciable fracture. No blastic or lytic bone lesions are appreciable. Soft tissues and spinal canal: Prevertebral soft tissues and predental space regions are normal. No evident cord or canal hematoma. No paraspinous lesions are evident. Disc levels: There is  severe disc space narrowing at C7-T1 with moderately severe disc space narrowing at C5-6 and C6-7. There is milder disc space narrowing at C2-3 and C3-4. At C2-3, there is moderate facet hypertrophy bilaterally. No nerve root edema or effacement. No disc extrusion or stenosis. At C3-4, there  is facet hypertrophy bilaterally, more severe on the left than on the right. There is slight impression on the exiting nerve root at C3-4 on the left due to bony hypertrophy. No disc extrusion or stenosis. At C4-5, there is facet hypertrophy which is marked on the left and moderate on the right. No nerve root edema or effacement. Mild impression on the exiting nerve root on the left at C4-5 is noted. At C5-6, there is moderate facet hypertrophy bilaterally. Facet hypertrophy bilaterally with stent symmetric impression on exiting nerve roots bilaterally. There is left paracentral disc protrusion without spinal stenosis. There is no frank disc extrusion. At C6-7, there is moderate facet hypertrophy bilaterally. There is impression on exiting nerve roots due to bony hypertrophy bilaterally. No frank disc extrusion or stenosis. At C7-T1, there is moderate facet hypertrophy bilaterally. There is mild impression on exiting nerve roots bilaterally due to bony hypertrophy. No frank disc extrusion or stenosis. Upper chest: There is mild upper lobe atelectatic change. No edema or airspace opacity. Other: None IMPRESSION: 1. Multilevel osteoarthritic change. Exit foraminal narrowing with impression on several exiting nerve roots due to bony hypertrophy as noted. No frank disc extrusion or stenosis. 2. No evident fracture. 3. 1 mm of anterolisthesis of C4 on C5 is stable. No new spondylolisthesis. Electronically Signed   By: Lowella Grip III M.D.   On: 09/11/2020 10:53   DG Chest Portable 1 View  Result Date: 09/11/2020 CLINICAL DATA:  Altered mental status EXAM: PORTABLE CHEST 1 VIEW COMPARISON:  None FINDINGS: Lungs are clear.  Heart is borderline prominent with pulmonary vascularity normal. No adenopathy. No bone lesions. IMPRESSION: Borderline cardiac prominence.  No edema or consolidation. Electronically Signed   By: Lowella Grip III M.D.   On: 09/11/2020 10:54  Last night  Procedures Procedures   Medications Ordered in ED Medications  sodium chloride 0.9 % bolus 500 mL (500 mLs Intravenous New Bag/Given 09/11/20 2671)    ED Course  I have reviewed the triage vital signs and the nursing notes.  Pertinent labs & imaging results that were available during my care of the patient were reviewed by me and considered in my medical decision making (see chart for details).    MDM Rules/Calculators/A&P                         86 year old female presents via EMS for evaluation of altered mental status.  Last known well time between 10 and 11 PM last night when she and her husband went to bed, at 2:30 husband woke up to find her feeling around looking for things in bed and then got up doing the same while walking around the room he tried to direct her back to bed but she was having difficulty following commands, she would not get all the way back in the bed, and so he sat next to her on a chair, at 4 AM she then got up again and seemed increasingly confused.  No falls.  No fevers has not been complaining of pain and has not been ill recently.  On arrival she is confused, somewhat oriented to self but not able to identify her husband, oriented to place but not time.  She has difficulty following commands, but does not seem to have focal weakness, numbness, has clear speech and no visual changes.  Complaining of posterior head and neck pain.  Will initiate broad work-up for potential causes of altered mental status will get CT  of the head, basic labs to look for metabolic encephalopathy or signs of infection, urinalysis, lactic acid, ammonia, Covid and chest x-ray.  I have independently ordered, reviewed and interpreted all  labs and imaging:  CT head with no acute intracranial abnormality, advanced cerebral white matter disease and arterial tortuosity noted.  Will proceed with CT angio of the head and neck and also get recon of the cervical spine given some neck pain.  CBC: Minimal leukocytosis of 11.2, normal hemoglobin CMP: Glucose of 138, mildly elevated creatinine and BUN suggesting some dehydration but no other significant electrolyte derangements, normal LFTs. Lactic acid: Not elevated Covid: Negative Ammonia: WNL UA: No hematuria or signs of infection.  Chest x-ray: No active cardiopulmonary disease.   CTA head and neck without hemodynamically significant stenosis of the carotids.  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  Incidental finding of some mild patchy groundglass in the right apex, could be edema versus infection.  Incidental finding of 7 mm right upper lobe nodule.  Right vertebral artery with superimposed severe focal stenosis.  No LVO or proximal high-grade arterial stenosis.  Case discussed with Dr. Rory Percy with neurology who recommends getting MRI of the brain.  Regardless of MRI result will need admission, if MRI shows stroke he will obviously see patient in consult, if no stroke will need further medical work-up and may consider EEG if no other etiology is found.  Consult placed for medical admission.  Case discussed with Dr. Roosevelt Locks with [facility] who will see and admit the patient.  Final Clinical Impression(s) / ED Diagnoses Final diagnoses:  Altered mental status, unspecified altered mental status type    Rx / DC Orders ED Discharge Orders    None       Janet Berlin 09/11/20 [place], MD 09/12/20 [phone]

## 2020-09-11 NOTE — ED Notes (Signed)
Patient transported to CT 

## 2020-09-11 NOTE — ED Notes (Signed)
PA at bedside.

## 2020-09-11 NOTE — H&P (Signed)
History and Physical    Anna Acevedo FVO:[id number] DOB: 06/11/1935 DOA: 09/11/2020  PCP: Denita Lung, MD (Confirm with patient/family/NH records and if not entered, this has to be entered at [facility] point of entry) Patient coming from: [facility]  I have personally briefly reviewed patient's old medical records in [place]  Chief Complaint: AMS  HPI: Anna Acevedo is a 86 y.o. female with medical history significant of HTN, GERD, HLD, sciatica on chronic on chronic Tylenol, presented with acute onset of confusion.  Husband at bedside provided most of the history.  Patient had a tooth removal 2 days ago, and has had intermittent and since then, has had irritation and bleeding on local gum tissue, she has been biting tea bag as directed by dentist to help stop the bleeding for 1 day and seemed worked.  However, the pain on the left jaw radiated to the left neck and worsening with neck movement.  Last night, patient woke up in the middle night wandering around confused, and forgetful and not following any command.  And this morning husband called EMS.  According to husband, there is no cough, no diarrhea.  And patient continued complain left-sided neck pain and left jaw pain.  Denied use of narcotics,_no new medications.  Husband reported the patient has not been eating and drinking well since yesterday evening because of the pain. ED Course: CT head negative for acute findings.  CTA showed C5 osteophyte pushing left vertebral artery causing 60% narrowing.  Intracranial and ICA patent bilaterally.  Review of Systems: Unable to perform, patient confused.   Past Medical History:  Diagnosis Date  . Colon polyp   . Diverticulosis   . Dyspnea   . GERD (gastroesophageal reflux disease)   . Hypercholesterolemia   . Hypertension    Essential  . Obesity   . Osteoporosis    OSTEOPENIA  . Palpitations   . Vitamin D deficiency     Past Surgical History:  Procedure Laterality  Date  . CARDIOVASCULAR STRESS TEST  03-30-2006   EF 72%. Showed no evidence of ischemia   . COLONOSCOPY     gets every 5 years  . EYE SURGERY Bilateral 2014   Cataracts  . US ECHOCARDIOGRAPHY  September 2007   EF 55-60%. Showing  impaired diastolic relaxation and mild mitral regurgitation     reports that she has never smoked. She has never used smokeless tobacco. She reports that she does not drink alcohol and does not use drugs.  Allergies  Allergen Reactions  . Lisinopril     cough  . Penicillins Hives and Swelling  . Zocor [Simvastatin]     Muscle Pain  . Amoxicillin Rash  . Cephalexin Rash    Family History  Problem Relation Age of Onset  . Hypertension Mother      Prior to Admission medications   Medication Sig Start Date End Date Taking? Authorizing Provider  meloxicam (MOBIC) 15 MG tablet TAKE 1 TABLET (15 MG TOTAL) BY MOUTH DAILY. 08/31/20   Denita Lung, MD  acetaminophen (TYLENOL) 500 MG tablet Take 500 mg by mouth every 6 (six) hours as needed for mild pain.     [provider]  aspirin 81 MG tablet Take 81 mg by mouth daily.    [provider]  CALCIUM PO Take 1 tablet by mouth daily. 600 plus d    [provider]  ibuprofen (ADVIL) 200 MG tablet Take 200 mg by mouth every 6 (six)  hours as needed.    [provider]  losartan-hydrochlorothiazide (HYZAAR) 50-12.5 MG tablet TAKE 1/2 TABLET BY MOUTH EVERY DAY 01/02/20   Hilty, Nadean Corwin, MD  metoprolol tartrate (LOPRESSOR) 50 MG tablet TAKE 1 TABLET BY MOUTH TWICE A DAY 08/14/20   Denita Lung, MD  Multiple Vitamin (MULTIVITAMIN PO) Take 1 tablet by mouth daily. Reported on 11/02/2015    [provider]  Multiple Vitamins-Minerals (ZINC PO) Take by mouth.    [provider]  pravastatin (PRAVACHOL) 40 MG tablet TAKE 1 TABLET BY MOUTH EVERY DAY 06/22/20   Denita Lung, MD  PROAIR HFA 108 [phone] Base) MCG/ACT inhaler TAKE 2 PUFFS BY MOUTH EVERY 6 HOURS AS NEEDED  FOR WHEEZE OR SHORTNESS OF BREATH 11/12/19   Denita Lung, MD  VITAMIN D PO Take by mouth.    [provider]    Physical Exam: Vitals:   09/11/20 1030 09/11/20 1045 09/11/20 1156 09/11/20 1300  BP: (!) 143/56 133/66 (!) 147/71 (!) 132/57  Pulse: (!) 101 86 92 89  Resp: 20 20 14  (!) 21  Temp:      TempSrc:      SpO2: 95% 95% 93% 92%    Constitutional: NAD, calm, comfortable Vitals:   09/11/20 1030 09/11/20 1045 09/11/20 1156 09/11/20 1300  BP: (!) 143/56 133/66 (!) 147/71 (!) 132/57  Pulse: (!) 101 86 92 89  Resp: 20 20 14  (!) 21  Temp:      TempSrc:      SpO2: 95% 95% 93% 92%   Eyes: PERRL, lids and conjunctivae normal ENMT: A part of tea bag trapping in local gum tissue on the left maxillary gum tissue and Removed with a forceps in room.  Mucous membranes appear to be dry. Neck: normal, supple, no masses, no thyromegaly Respiratory: clear to auscultation bilaterally, no wheezing, no crackles. Normal respiratory effort. No accessory muscle use.  Cardiovascular: Regular rate and rhythm, no murmurs / rubs / gallops. No extremity edema. 2+ pedal pulses. No carotid bruits.  Abdomen: no tenderness, no masses palpated. No hepatosplenomegaly. Bowel sounds positive.  Musculoskeletal: no clubbing / cyanosis. No joint deformity upper and lower extremities. Good ROM, no contractures. Normal muscle tone.  Skin: no rashes, lesions, ulcers. No induration Neurologic: CN 2-12 grossly intact. Sensation intact, DTR normal. Strength 5/5 in all 4.  Tenderness on left side of the neck, weakness on raising left shoulder Psychiatric: Normal judgment and insight. Alert and oriented x 3. Normal mood.     Labs on Admission: I have personally reviewed following labs and imaging studies  CBC: Recent Labs  Lab 09/11/20 0648 09/11/20 0724  WBC 11.2*  --   NEUTROABS 8.7*  --   HGB 12.7 13.6  HCT 40.9 40.0  MCV 85.9  --   PLT 216  --    Basic Metabolic Panel: Recent Labs  Lab  09/11/20 0648 09/11/20 0724  NA 136 139  K 3.7 4.0  CL 99 103  CO2 25  --   GLUCOSE 138* 130*  BUN 27* 34*  CREATININE 1.12* 1.00  CALCIUM 9.5  --    GFR: CrCl cannot be calculated (Unknown ideal weight.). Liver Function Tests: Recent Labs  Lab 09/11/20 0648  AST 26  ALT 60*  ALKPHOS 122  BILITOT 0.4  PROT 7.0  ALBUMIN 3.8   No results for input(s): LIPASE, AMYLASE in the last 168 hours. Recent Labs  Lab 09/11/20 0656  AMMONIA 31   Coagulation Profile: Recent  Labs  Lab 09/11/20 0648  INR 1.0   Cardiac Enzymes: No results for input(s): CKTOTAL, CKMB, CKMBINDEX, TROPONINI in the last 168 hours. BNP (last 3 results) No results for input(s): PROBNP in the last 8760 hours. HbA1C: No results for input(s): HGBA1C in the last 72 hours. CBG: No results for input(s): GLUCAP in the last 168 hours. Lipid Profile: No results for input(s): CHOL, HDL, LDLCALC, TRIG, CHOLHDL, LDLDIRECT in the last 72 hours. Thyroid Function Tests: No results for input(s): TSH, T4TOTAL, FREET4, T3FREE, THYROIDAB in the last 72 hours. Anemia Panel: No results for input(s): VITAMINB12, FOLATE, FERRITIN, TIBC, IRON, RETICCTPCT in the last 72 hours. Urine analysis:    Component Value Date/Time   COLORURINE YELLOW 09/11/2020 1120   APPEARANCEUR CLEAR 09/11/2020 1120   LABSPEC 1.040 (H) 09/11/2020 1120   PHURINE 5.0 09/11/2020 1120   GLUCOSEU NEGATIVE 09/11/2020 1120   HGBUR NEGATIVE 09/11/2020 [place] 09/11/2020 1120   BILIRUBINUR n 04/04/2013 [place] 09/11/2020 1120   PROTEINUR NEGATIVE 09/11/2020 1120   UROBILINOGEN negative 04/04/2013 1319   NITRITE NEGATIVE 09/11/2020 1120   LEUKOCYTESUR TRACE (A) 09/11/2020 1120    Radiological Exams on Admission: CT Angio Head W or Wo Contrast  Result Date: 09/11/2020 CLINICAL DATA:  Stroke/TIA, assess intracranial arteries. Assess extracranial arteries. EXAM: CT ANGIOGRAPHY HEAD AND NECK TECHNIQUE:  Multidetector CT imaging of the head and neck was performed using the standard protocol during bolus administration of intravenous contrast. Multiplanar CT image reconstructions and MIPs were obtained to evaluate the vascular anatomy. Carotid stenosis measurements (when applicable) are obtained utilizing NASCET criteria, using the distal internal carotid diameter as the denominator. CONTRAST:  20mL OMNIPAQUE IOHEXOL 350 MG/ML SOLN COMPARISON:  Noncontrast head CT performed earlier today 09/11/2020. FINDINGS: CTA NECK FINDINGS Aortic arch: Common origin of the innominate and left common carotid arteries. Atherosclerotic plaque within the visualized aortic arch and proximal major branch vessels of the neck. No hemodynamically significant innominate or proximal subclavian artery stenosis. Right carotid system: CCA and ICA patent within the neck without significant stenosis (50% or greater). Mild soft and calcified plaque within the carotid bifurcation and proximal ICA. Left carotid system: CCA and ICA patent within the neck without significant stenosis (50% or greater). Mild soft and calcified plaque within the carotid bifurcation and proximal ICA. Vertebral arteries: The right vertebral artery is developmentally diminutive, but patent throughout the neck. Left vertebral artery strongly dominant and patent throughout the neck. 60% narrowing of the left vertebral artery at C5 level due to mass effect from cervical spine osteophytes. Skeleton: Please refer to separately reported CT of the cervical spine for cervical spine findings. No acute bony abnormality or aggressive osseous lesion elsewhere. Other neck: No neck mass or cervical lymphadenopathy. Thyroid unremarkable. Upper chest: Mild patchy ground-glass opacity within the right lung apex. 7 mm nodule within the right lung apex (series 7, image 328). Review of the MIP images confirms the above findings CTA HEAD FINDINGS There is dolichoectasia of the anterior and  posterior circulation. Anterior circulation: The intracranial internal carotid arteries are patent. The M1 middle cerebral arteries are patent. No M2 proximal branch occlusion or high-grade proximal stenosis is identified. The anterior cerebral arteries are patent. No intracranial aneurysm is identified. Posterior circulation: The intracranial right vertebral artery is developmentally diminutive, but patent. Superimposed high-grade stenosis within the proximal V4 right vertebral artery. The dominant intracranial left vertebral artery is patent with minimal nonstenotic calcified plaque. The basilar artery is patent.  The posterior cerebral arteries are patent. Sizable right posterior communicating artery. A small left posterior communicating artery is also present. Venous sinuses: Within the limitations of contrast timing, no convincing thrombus. Anatomic variants: None significant Review of the MIP images confirms the above findings IMPRESSION: CTA neck: 1. Common and internal carotid arteries patent within the neck without hemodynamically significant stenosis. Mild soft and calcified plaque within the carotid bifurcations and proximal ICAs bilaterally. 2. Vertebral arteries patent within the neck. The left vertebral artery is strongly dominant. 60% narrowing of the left vertebral artery at the C5 level due to mass effect from cervical spine osteophytes. 3. Mild patchy ground-glass opacity within the right lung apex. This finding is nonspecific but may reflect edema or infection. Clinical correlation is recommended. 4. 7 mm right upper lobe pulmonary nodule. Non-contrast chest CT at 6-12 months is recommended. If the nodule is stable at time of repeat CT, then future CT at 18-24 months (from today's scan) is considered optional for low-risk patients, but is recommended for high-risk patients. This recommendation follows the consensus statement: Guidelines for Management of Incidental Pulmonary Nodules Detected on CT  Images: From the Fleischner Society 2017; Radiology 2017; 284:228-243. CTA head: 1. The right vertebral artery remains developmentally diminutive intracranially. Superimposed severe focal stenosis within the proximal V4 segment of this vessel. 2. Elsewhere, no intracranial large vessel occlusion or proximal high-grade arterial stenosis. 3. Dolichoectasia of the intracranial anterior and posterior circulation suggestive of longstanding hypertension. Electronically Signed   By: Kellie Simmering DO   On: 09/11/2020 10:47   CT Head Wo Contrast  Result Date: 09/11/2020 CLINICAL DATA:  86 year old female who woke at 2 a.m. with confusion. Headache. EXAM: CT HEAD WITHOUT CONTRAST TECHNIQUE: Contiguous axial images were obtained from the base of the skull through the vertex without intravenous contrast. COMPARISON:  Report of head CT 09/06/2010 (no images available). FINDINGS: Brain: Faint fairly symmetric dystrophic calcifications in the bilateral basal ganglia. Confluent bilateral cerebral white matter hypodensity. No midline shift, ventriculomegaly, mass effect, evidence of mass lesion, intracranial hemorrhage or evidence of cortically based acute infarction. No cortical encephalomalacia identified. Vascular: Calcified atherosclerosis at the skull base. Generalized arterial ectasia, including of the dominant left vertebral artery. No suspicious intracranial vascular hyperdensity. Skull: No acute osseous abnormality identified. Sinuses/Orbits: Minimal to mild maxillary sinus mucosal thickening greater on the left. Other visualized paranasal sinuses and mastoids are clear. Tympanic cavities are clear. Other: Leftward gaze deviation. No other acute orbit or scalp soft tissue finding. IMPRESSION: 1. No acute intracranial abnormality. 2. Advanced cerebral white matter disease, most commonly due to chronic small vessel ischemia. 3. Generalized intracranial artery tortuosity, calcified atherosclerosis. Electronically Signed    By: Genevie Ann M.D.   On: 09/11/2020 07:47   CT Angio Neck W and/or Wo Contrast  Result Date: 09/11/2020 CLINICAL DATA:  Stroke/TIA, assess intracranial arteries. Assess extracranial arteries. EXAM: CT ANGIOGRAPHY HEAD AND NECK TECHNIQUE: Multidetector CT imaging of the head and neck was performed using the standard protocol during bolus administration of intravenous contrast. Multiplanar CT image reconstructions and MIPs were obtained to evaluate the vascular anatomy. Carotid stenosis measurements (when applicable) are obtained utilizing NASCET criteria, using the distal internal carotid diameter as the denominator. CONTRAST:  70mL OMNIPAQUE IOHEXOL 350 MG/ML SOLN COMPARISON:  Noncontrast head CT performed earlier today 09/11/2020. FINDINGS: CTA NECK FINDINGS Aortic arch: Common origin of the innominate and left common carotid arteries. Atherosclerotic plaque within the visualized aortic arch and proximal major branch vessels of the neck.  No hemodynamically significant innominate or proximal subclavian artery stenosis. Right carotid system: CCA and ICA patent within the neck without significant stenosis (50% or greater). Mild soft and calcified plaque within the carotid bifurcation and proximal ICA. Left carotid system: CCA and ICA patent within the neck without significant stenosis (50% or greater). Mild soft and calcified plaque within the carotid bifurcation and proximal ICA. Vertebral arteries: The right vertebral artery is developmentally diminutive, but patent throughout the neck. Left vertebral artery strongly dominant and patent throughout the neck. 60% narrowing of the left vertebral artery at C5 level due to mass effect from cervical spine osteophytes. Skeleton: Please refer to separately reported CT of the cervical spine for cervical spine findings. No acute bony abnormality or aggressive osseous lesion elsewhere. Other neck: No neck mass or cervical lymphadenopathy. Thyroid unremarkable. Upper chest:  Mild patchy ground-glass opacity within the right lung apex. 7 mm nodule within the right lung apex (series 7, image 328). Review of the MIP images confirms the above findings CTA HEAD FINDINGS There is dolichoectasia of the anterior and posterior circulation. Anterior circulation: The intracranial internal carotid arteries are patent. The M1 middle cerebral arteries are patent. No M2 proximal branch occlusion or high-grade proximal stenosis is identified. The anterior cerebral arteries are patent. No intracranial aneurysm is identified. Posterior circulation: The intracranial right vertebral artery is developmentally diminutive, but patent. Superimposed high-grade stenosis within the proximal V4 right vertebral artery. The dominant intracranial left vertebral artery is patent with minimal nonstenotic calcified plaque. The basilar artery is patent. The posterior cerebral arteries are patent. Sizable right posterior communicating artery. A small left posterior communicating artery is also present. Venous sinuses: Within the limitations of contrast timing, no convincing thrombus. Anatomic variants: None significant Review of the MIP images confirms the above findings IMPRESSION: CTA neck: 1. Common and internal carotid arteries patent within the neck without hemodynamically significant stenosis. Mild soft and calcified plaque within the carotid bifurcations and proximal ICAs bilaterally. 2. Vertebral arteries patent within the neck. The left vertebral artery is strongly dominant. 60% narrowing of the left vertebral artery at the C5 level due to mass effect from cervical spine osteophytes. 3. Mild patchy ground-glass opacity within the right lung apex. This finding is nonspecific but may reflect edema or infection. Clinical correlation is recommended. 4. 7 mm right upper lobe pulmonary nodule. Non-contrast chest CT at 6-12 months is recommended. If the nodule is stable at time of repeat CT, then future CT at 18-24  months (from today's scan) is considered optional for low-risk patients, but is recommended for high-risk patients. This recommendation follows the consensus statement: Guidelines for Management of Incidental Pulmonary Nodules Detected on CT Images: From the Fleischner Society 2017; Radiology 2017; 284:228-243. CTA head: 1. The right vertebral artery remains developmentally diminutive intracranially. Superimposed severe focal stenosis within the proximal V4 segment of this vessel. 2. Elsewhere, no intracranial large vessel occlusion or proximal high-grade arterial stenosis. 3. Dolichoectasia of the intracranial anterior and posterior circulation suggestive of longstanding hypertension. Electronically Signed   By: Kellie Simmering DO   On: 09/11/2020 10:47   CT C-SPINE NO CHARGE  Result Date: 09/11/2020 CLINICAL DATA:  Cervicalgia EXAM: CT CERVICAL SPINE WITHOUT CONTRAST TECHNIQUE: Multidetector CT imaging of the cervical spine was performed without intravenous contrast. Multiplanar CT image reconstructions were also generated. COMPARISON:  CT angiogram neck September 12, 2019; cervical spine CT September 06, 2010 FINDINGS: Alignment: There is stable 1 mm of anterolisthesis of C4 on C5. No other spondylolisthesis  evident. Skull base and vertebrae: Skull base and craniocervical junction regions appear normal. There is no appreciable fracture. No blastic or lytic bone lesions are appreciable. Soft tissues and spinal canal: Prevertebral soft tissues and predental space regions are normal. No evident cord or canal hematoma. No paraspinous lesions are evident. Disc levels: There is severe disc space narrowing at C7-T1 with moderately severe disc space narrowing at C5-6 and C6-7. There is milder disc space narrowing at C2-3 and C3-4. At C2-3, there is moderate facet hypertrophy bilaterally. No nerve root edema or effacement. No disc extrusion or stenosis. At C3-4, there is facet hypertrophy bilaterally, more severe on the  left than on the right. There is slight impression on the exiting nerve root at C3-4 on the left due to bony hypertrophy. No disc extrusion or stenosis. At C4-5, there is facet hypertrophy which is marked on the left and moderate on the right. No nerve root edema or effacement. Mild impression on the exiting nerve root on the left at C4-5 is noted. At C5-6, there is moderate facet hypertrophy bilaterally. Facet hypertrophy bilaterally with stent symmetric impression on exiting nerve roots bilaterally. There is left paracentral disc protrusion without spinal stenosis. There is no frank disc extrusion. At C6-7, there is moderate facet hypertrophy bilaterally. There is impression on exiting nerve roots due to bony hypertrophy bilaterally. No frank disc extrusion or stenosis. At C7-T1, there is moderate facet hypertrophy bilaterally. There is mild impression on exiting nerve roots bilaterally due to bony hypertrophy. No frank disc extrusion or stenosis. Upper chest: There is mild upper lobe atelectatic change. No edema or airspace opacity. Other: None IMPRESSION: 1. Multilevel osteoarthritic change. Exit foraminal narrowing with impression on several exiting nerve roots due to bony hypertrophy as noted. No frank disc extrusion or stenosis. 2. No evident fracture. 3. 1 mm of anterolisthesis of C4 on C5 is stable. No new spondylolisthesis. Electronically Signed   By: Lowella Grip III M.D.   On: 09/11/2020 10:53   DG Chest Portable 1 View  Result Date: 09/11/2020 CLINICAL DATA:  Altered mental status EXAM: PORTABLE CHEST 1 VIEW COMPARISON:  None FINDINGS: Lungs are clear. Heart is borderline prominent with pulmonary vascularity normal. No adenopathy. No bone lesions. IMPRESSION: Borderline cardiac prominence.  No edema or consolidation. Electronically Signed   By: Lowella Grip III M.D.   On: 09/11/2020 10:54    EKG: Independently reviewed.  Flattening of T waves  Assessment/Plan Active Problems:   AMS  (altered mental status)  (please populate well all problems here in Problem List. (For example, if patient is on BP meds at home and you resume or decide to hold them, it is a problem that needs to be her. Same for CAD, COPD, HLD and so on)  Acute encephalopathy -Suspect this is related to the gum tissue pain and dehydration -Part of tea bag which trapped in the residue gum tissue was removed, ordered mouth wash -IVF x 12hours then re-evaluate. -Neurology was consulted in ED, who ordered MRI of the brain to rule out stroke.  Neurology for the plan for EEG if MRI negative. -Neck pain appears to be acute and independent of the pain on the gum tissue, given the significant finding of C5 osteophyte, ordered MRI cervical spine to rule out severe cervical spine abnormality -Avoid narcotic -[place] clean, no signs of PNA on Xray  HTN -Continue home meds  COPD -No symptoms signs of acute exacerbation.  DVT prophylaxis: Lovenox Code Status: Full code Family Communication:  Husband at bedside Disposition Plan: Expect less than 2 midnight hospital stay, once patient's mentation improves, likely can be discharged home within 24 hours. Consults called: Neurology Admission status: Telemetry observation   Lequita Halt MD [facility] Pager [phone]  09/11/2020, 1:45 PM

## 2020-09-11 NOTE — ED Triage Notes (Signed)
[facility], family states pt woke up 2am confused and has "poor recall". Pt had extraction on Wed, Lower lip swelling. Pt orientated to person and place. C/O headache to back of head .

## 2020-09-11 NOTE — ED Notes (Signed)
Dinner Tray Ordered @ 1711. 

## 2020-09-12 ENCOUNTER — Encounter (HOSPITAL_COMMUNITY): Payer: Self-pay | Admitting: Internal Medicine

## 2020-09-12 ENCOUNTER — Other Ambulatory Visit: Payer: Self-pay

## 2020-09-12 DIAGNOSIS — R4182 Altered mental status, unspecified: Secondary | ICD-10-CM | POA: Diagnosis not present

## 2020-09-12 LAB — VITAMIN B12: Vitamin B-12: 204 pg/mL (ref 180–914)

## 2020-09-12 LAB — URINE CULTURE: Culture: 40000 — AB

## 2020-09-12 LAB — PROCALCITONIN: Procalcitonin: <0.1 ng/mL

## 2020-09-12 LAB — FOLATE: Folate: 12.9 ng/mL (ref >5.9)

## 2020-09-12 MED ORDER — CYANOCOBALAMIN 1000 MCG/ML IJ SOLN
1000.0000 ug | Freq: Once | INTRAMUSCULAR | Status: AC
  Administered 2020-09-12: 1000 ug via INTRAMUSCULAR
  Filled 2020-09-12: qty 1

## 2020-09-12 MED ORDER — VITAMIN B-12 1000 MCG PO TABS: 1000.0000 ug | ORAL_TABLET | Freq: Every day | ORAL | 0 refills | Status: AC

## 2020-09-12 NOTE — Progress Notes (Signed)
SLP Cancellation Note  Patient Details Name: Anna Acevedo MRN: [id number] DOB: July 23, 1934   Cancelled treatment:       Reason Eval/Treat Not Completed: Other (comment) (Secure chatted with MD who is planning to discharge patient today and is no longer requesting BSE with ST)   Sonia Baller, MA, [place] [facility]

## 2020-09-12 NOTE — Progress Notes (Signed)
         Patient instructed on AVS including the need to call and set up an appointment with PCP in the next 1-2 weeks.  Educated on new medication and where to obtain.  PIVs were removed and patient was assisted to get dresses while husband went for car.  All questions were answered and belongings sent with patient.

## 2020-09-12 NOTE — Discharge Summary (Signed)
Physician Discharge Summary  Anna Acevedo KKX:[id number] DOB: Nov 19, 1934 DOA: 09/11/2020  PCP: Denita Lung, MD  Admit date: 09/11/2020 Discharge date: 09/12/2020  Time spent: 40 minutes  Recommendations for Outpatient Follow-up:  1. Follow outpatient CBC/CMP 2. Follow pulmonary nodule outpatient - 7 mm R upper lobe pulm nodule - needs non contrast CT within 6-12 months 3. Mild patchy ground glass opacity within R lung apex - no sx concerning for infection - in setting of above, would consider outpatient CT scan in follow up (in setting of no CT chest here, I'd favor outpatient CT chest after discharge and then following nodule as recommended above) 4. Marrow edema associated with R C6 posterior elements - suspected mechanical/degenerative, but follow outpatient  5. Low normal B12, started on B12 supplementation - follow pending methylmalonic acid (pending) 6. GBS uti - asymptomatic - follow for symptoms   Discharge Diagnoses:  Active Problems:   AMS (altered mental status)   Discharge Condition: stable  Diet recommendation: heart healthy  There were no vitals filed for this visit.  History of present illness:  Per HPI Anna Acevedo is Anna Acevedo 86 y.o. female with medical history significant of HTN, GERD, HLD, sciatica on chronic on chronic Tylenol, presented with acute onset of confusion.  Husband at bedside provided most of the history.  Patient had Anna Acevedo tooth removal 2 days ago, and has had intermittent pain since then, has had irritation and bleeding on local gum tissue, she has been biting tea bag as directed by dentist to help stop the bleeding for 1 day and seemed worked.  However, the pain on the left jaw radiated to the left neck and worsening with neck movement.  Last night, patient woke up in the middle night wandering around confused, and forgetful and not following any command.  And this morning husband called EMS.  According to husband, there is no cough, no  diarrhea.  And patient continued complain left-sided neck pain and left jaw pain.  Denied use of narcotics,_no new medications.  Husband reported the patient has not been eating and drinking well since yesterday evening because of the pain.  She was admitted due to acute metabolic encephalopathy.  This appears to have been in the setting of pain and difficulty sleeping after tooth extraction.  She was admitted for encephalopathy, workup has been relatively unrevealing except for asymptomatic bacteruria and low normal B12.  She was at her baseline on 2/26 am and discharged with plan for outpatient follow up.  See below for additional details  Hospital Course:  Acute Metabolic Encephalopathy Likely related to pain and dehydration after tooth extraction  She's back to baseline this morning per discussion with her and her husband B12 is low normal, supplement Asymptomatic bacteruria, no abx indicated MRI brain without stroke, CTA neck did show patchy ggo within R lung apex (concerning for infection), but as she's back to normal with no resp symptoms, will defer further evaluation to the outpatient setting Ammonia wnl, vitamin b12 low normal.  Folate wnl. Delirium precautions Head CT without intracranial abnormality, advanced white matter disease, intracranial artery tortuosity CTA neck with patent common and internal carotid arteries, mild soft and calcified plaque within carotid bifurcations and proximal ICA's bilaterally - vertebral arteries patent within the neck (see report) Follow outpatient with PCP  Neck Pain Resolved today, suspect this is related to her tooth pain after extraction MRI cervical spine with degenerative changes - motion artifact, suboptimal stenosis evaluation - no high grade canal stenosis,  foraminal narrowing greatest at C5-6.  Marrow edema associated with R c6 posterior elements. CT c spine with multilevel osteoarthritic change, exit foraminal narrowing, no evident fx, 1  mm of anterolisthesis of C4 on C5 (stable) Follow outpatient with PCP, seems improved  Patchy Ground Glass Opacity within R lung apex: edema vs infection - she's asymptomatic from pulm standpoint and back to baseline - would follow outpatient CT chest with PCP (will defer imaging here after discussion with pt/husband given her return to baseline)  Pulmonary nodule: follow outpatient within 6-12 months  Hypertension Continue home meds  HLD Continue statin CTA with soft/calcified plaque within carotid bifurcations and proximal ICA's - continue aspirin/statin  COPD asx  Asymptomatic bacteruria: no UTI sx, follow outpatient  Procedures: none  Consultations:  none  Discharge Exam: Vitals:   09/12/20 0845 09/12/20 1134  BP: 138/81 (!) 109/44  Pulse: 70 (!) 57  Resp: 18 18  Temp: 98.2 F (36.8 C) 98.4 F (36.9 C)  SpO2: 98% 96%   Anna Acevedo&Ox3 Feels back to baseline, had hard time sleeping prior to admission in setting of pain Husband thinks she's just about normal Discussed plan for further w/u vs discharge (discussed possibility of CT chest here vs outpatient, they preferred outpatient as she's improved) - discussed follow up recs  General: No acute distress. L jaw swelling/bruising Cardiovascular: Heart sounds show Anna Acevedo regular rate, and rhythm Lungs: Clear to auscultation bilaterally  Abdomen: Soft, nontender, nondistended  Neurological: Alert and oriented 3. Moves all extremities 4. Cranial nerves II through XII intact. Skin: Warm and dry. No rashes or lesions. Extremities: No clubbing or cyanosis. No edema  Discharge Instructions   Discharge Instructions    Call MD for:  difficulty breathing, headache or visual disturbances   Complete by: As directed    Call MD for:  extreme fatigue   Complete by: As directed    Call MD for:  hives   Complete by: As directed    Call MD for:  persistant dizziness or light-headedness   Complete by: As directed    Call MD for:   persistant nausea and vomiting   Complete by: As directed    Call MD for:  redness, tenderness, or signs of infection (pain, swelling, redness, odor or green/yellow discharge around incision site)   Complete by: As directed    Call MD for:  severe uncontrolled pain   Complete by: As directed    Call MD for:  temperature >100.4   Complete by: As directed    Diet - low sodium heart healthy   Complete by: As directed    Discharge instructions   Complete by: As directed    You were seen for an episode of confusion.  I think this was probably delirium (confusion) related to pain from your recent dental extraction.  Your symptoms have resolved on the day of discharge.    You had workup revealing of Ethel Meisenheimer low normal vitamin b12.  We'll discharge you with B12 supplementation.  Please follow up with your PCP to follow up the results of the methylmalonic acid (which are pending at the time of discharge).  Your urine culture showed infection, but since you are asymptomatic, I think this is more likely colonization or asymptomatic bacteruria that doesn't need treatment unless you develop symptoms (follow up with your PCP).  You had Romey Cohea lung nodule that should be followed up outpatient.  There was abnormal findings on the MRI of your C spine (neck) that should be followed  up with your PCP as well (marrow edema).  Please discuss your imaging studies with your PCP and ensure that you follow up the abnormal studies.  Follow with your PCP outpatient after this hospitalization.  Return for new, recurrent, or worsening symptoms.  Please ask your PCP to request records from this hospitalization so they know what was done and what the next steps will be.   Increase activity slowly   Complete by: As directed      Allergies as of 09/12/2020      Reactions   Lisinopril Cough   Penicillins Hives, Swelling   Zocor [simvastatin] Other (See Comments)   Muscle Pain   Amoxicillin Rash   Cephalexin Rash       Medication List    STOP taking these medications   ibuprofen 200 MG tablet Commonly known as: ADVIL     TAKE these medications   acetaminophen 500 MG tablet Commonly known as: TYLENOL Take 500 mg by mouth 2 (two) times daily.   aspirin 81 MG tablet Take 81 mg by mouth every morning.   CALCIUM 600-D PO Take 1 tablet by mouth every morning.   losartan-hydrochlorothiazide 50-12.5 MG tablet Commonly known as: HYZAAR TAKE 1/2 TABLET BY MOUTH EVERY DAY What changed: when to take this   metoprolol tartrate 50 MG tablet Commonly known as: LOPRESSOR TAKE 1 TABLET BY MOUTH TWICE Adron Geisel DAY   pravastatin 40 MG tablet Commonly known as: PRAVACHOL TAKE 1 TABLET BY MOUTH EVERY DAY What changed: when to take this   ProAir HFA 108 (90 Base) MCG/ACT inhaler Generic drug: albuterol TAKE 2 PUFFS BY MOUTH EVERY 6 HOURS AS NEEDED FOR WHEEZE OR SHORTNESS OF BREATH What changed: See the new instructions.   vitamin B-12 1000 MCG tablet Commonly known as: CYANOCOBALAMIN Take 1 tablet (1,000 mcg total) by mouth daily.   VITAMIN D3 PO Take 1 tablet by mouth every morning.   ZINC PO Take 1 tablet by mouth every morning.      Allergies  Allergen Reactions  . Lisinopril Cough  . Penicillins Hives and Swelling  . Zocor [Simvastatin] Other (See Comments)    Muscle Pain  . Amoxicillin Rash  . Cephalexin Rash      The results of significant diagnostics from this hospitalization (including imaging, microbiology, ancillary and laboratory) are listed below for reference.    Significant Diagnostic Studies: CT Angio Head W or Wo Contrast  Result Date: 09/11/2020 CLINICAL DATA:  Stroke/TIA, assess intracranial arteries. Assess extracranial arteries. EXAM: CT ANGIOGRAPHY HEAD AND NECK TECHNIQUE: Multidetector CT imaging of the head and neck was performed using the standard protocol during bolus administration of intravenous contrast. Multiplanar CT image reconstructions and MIPs were obtained  to evaluate the vascular anatomy. Carotid stenosis measurements (when applicable) are obtained utilizing NASCET criteria, using the distal internal carotid diameter as the denominator. CONTRAST:  81mL OMNIPAQUE IOHEXOL 350 MG/ML SOLN COMPARISON:  Noncontrast head CT performed earlier today 09/11/2020. FINDINGS: CTA NECK FINDINGS Aortic arch: Common origin of the innominate and left common carotid arteries. Atherosclerotic plaque within the visualized aortic arch and proximal major branch vessels of the neck. No hemodynamically significant innominate or proximal subclavian artery stenosis. Right carotid system: CCA and ICA patent within the neck without significant stenosis (50% or greater). Mild soft and calcified plaque within the carotid bifurcation and proximal ICA. Left carotid system: CCA and ICA patent within the neck without significant stenosis (50% or greater). Mild soft and calcified plaque within the carotid bifurcation and  proximal ICA. Vertebral arteries: The right vertebral artery is developmentally diminutive, but patent throughout the neck. Left vertebral artery strongly dominant and patent throughout the neck. 60% narrowing of the left vertebral artery at C5 level due to mass effect from cervical spine osteophytes. Skeleton: Please refer to separately reported CT of the cervical spine for cervical spine findings. No acute bony abnormality or aggressive osseous lesion elsewhere. Other neck: No neck mass or cervical lymphadenopathy. Thyroid unremarkable. Upper chest: Mild patchy ground-glass opacity within the right lung apex. 7 mm nodule within the right lung apex (series 7, image 328). Review of the MIP images confirms the above findings CTA HEAD FINDINGS There is dolichoectasia of the anterior and posterior circulation. Anterior circulation: The intracranial internal carotid arteries are patent. The M1 middle cerebral arteries are patent. No M2 proximal branch occlusion or high-grade proximal  stenosis is identified. The anterior cerebral arteries are patent. No intracranial aneurysm is identified. Posterior circulation: The intracranial right vertebral artery is developmentally diminutive, but patent. Superimposed high-grade stenosis within the proximal V4 right vertebral artery. The dominant intracranial left vertebral artery is patent with minimal nonstenotic calcified plaque. The basilar artery is patent. The posterior cerebral arteries are patent. Sizable right posterior communicating artery. Leen Tworek small left posterior communicating artery is also present. Venous sinuses: Within the limitations of contrast timing, no convincing thrombus. Anatomic variants: None significant Review of the MIP images confirms the above findings IMPRESSION: CTA neck: 1. Common and internal carotid arteries patent within the neck without hemodynamically significant stenosis. Mild soft and calcified plaque within the carotid bifurcations and proximal ICAs bilaterally. 2. Vertebral arteries patent within the neck. The left vertebral artery is strongly dominant. 60% narrowing of the left vertebral artery at the C5 level due to mass effect from cervical spine osteophytes. 3. Mild patchy ground-glass opacity within the right lung apex. This finding is nonspecific but may reflect edema or infection. Clinical correlation is recommended. 4. 7 mm right upper lobe pulmonary nodule. Non-contrast chest CT at 6-12 months is recommended. If the nodule is stable at time of repeat CT, then future CT at 18-24 months (from today's scan) is considered optional for low-risk patients, but is recommended for high-risk patients. This recommendation follows the consensus statement: Guidelines for Management of Incidental Pulmonary Nodules Detected on CT Images: From the Fleischner Society 2017; Radiology 2017; 284:228-243. CTA head: 1. The right vertebral artery remains developmentally diminutive intracranially. Superimposed severe focal stenosis  within the proximal V4 segment of this vessel. 2. Elsewhere, no intracranial large vessel occlusion or proximal high-grade arterial stenosis. 3. Dolichoectasia of the intracranial anterior and posterior circulation suggestive of longstanding hypertension. Electronically Signed   By: Kellie Simmering DO   On: 09/11/2020 10:47   CT Head Wo Contrast  Result Date: 09/11/2020 CLINICAL DATA:  86 year old female who woke at 2 [place].m. with confusion. Headache. EXAM: CT HEAD WITHOUT CONTRAST TECHNIQUE: Contiguous axial images were obtained from the base of the skull through the vertex without intravenous contrast. COMPARISON:  Report of head CT 09/06/2010 (no images available). FINDINGS: Brain: Faint fairly symmetric dystrophic calcifications in the bilateral basal ganglia. Confluent bilateral cerebral white matter hypodensity. No midline shift, ventriculomegaly, mass effect, evidence of mass lesion, intracranial hemorrhage or evidence of cortically based acute infarction. No cortical encephalomalacia identified. Vascular: Calcified atherosclerosis at the skull base. Generalized arterial ectasia, including of the dominant left vertebral artery. No suspicious intracranial vascular hyperdensity. Skull: No acute osseous abnormality identified. Sinuses/Orbits: Minimal to mild maxillary sinus mucosal thickening  greater on the left. Other visualized paranasal sinuses and mastoids are clear. Tympanic cavities are clear. Other: Leftward gaze deviation. No other acute orbit or scalp soft tissue finding. IMPRESSION: 1. No acute intracranial abnormality. 2. Advanced cerebral white matter disease, most commonly due to chronic small vessel ischemia. 3. Generalized intracranial artery tortuosity, calcified atherosclerosis. Electronically Signed   By: Genevie Ann M.D.   On: 09/11/2020 07:47   CT Angio Neck W and/or Wo Contrast  Result Date: 09/11/2020 CLINICAL DATA:  Stroke/TIA, assess intracranial arteries. Assess extracranial arteries.  EXAM: CT ANGIOGRAPHY HEAD AND NECK TECHNIQUE: Multidetector CT imaging of the head and neck was performed using the standard protocol during bolus administration of intravenous contrast. Multiplanar CT image reconstructions and MIPs were obtained to evaluate the vascular anatomy. Carotid stenosis measurements (when applicable) are obtained utilizing NASCET criteria, using the distal internal carotid diameter as the denominator. CONTRAST:  53mL OMNIPAQUE IOHEXOL 350 MG/ML SOLN COMPARISON:  Noncontrast head CT performed earlier today 09/11/2020. FINDINGS: CTA NECK FINDINGS Aortic arch: Common origin of the innominate and left common carotid arteries. Atherosclerotic plaque within the visualized aortic arch and proximal major branch vessels of the neck. No hemodynamically significant innominate or proximal subclavian artery stenosis. Right carotid system: CCA and ICA patent within the neck without significant stenosis (50% or greater). Mild soft and calcified plaque within the carotid bifurcation and proximal ICA. Left carotid system: CCA and ICA patent within the neck without significant stenosis (50% or greater). Mild soft and calcified plaque within the carotid bifurcation and proximal ICA. Vertebral arteries: The right vertebral artery is developmentally diminutive, but patent throughout the neck. Left vertebral artery strongly dominant and patent throughout the neck. 60% narrowing of the left vertebral artery at C5 level due to mass effect from cervical spine osteophytes. Skeleton: Please refer to separately reported CT of the cervical spine for cervical spine findings. No acute bony abnormality or aggressive osseous lesion elsewhere. Other neck: No neck mass or cervical lymphadenopathy. Thyroid unremarkable. Upper chest: Mild patchy ground-glass opacity within the right lung apex. 7 mm nodule within the right lung apex (series 7, image 328). Review of the MIP images confirms the above findings CTA HEAD FINDINGS  There is dolichoectasia of the anterior and posterior circulation. Anterior circulation: The intracranial internal carotid arteries are patent. The M1 middle cerebral arteries are patent. No M2 proximal branch occlusion or high-grade proximal stenosis is identified. The anterior cerebral arteries are patent. No intracranial aneurysm is identified. Posterior circulation: The intracranial right vertebral artery is developmentally diminutive, but patent. Superimposed high-grade stenosis within the proximal V4 right vertebral artery. The dominant intracranial left vertebral artery is patent with minimal nonstenotic calcified plaque. The basilar artery is patent. The posterior cerebral arteries are patent. Sizable right posterior communicating artery. Keegen Heffern small left posterior communicating artery is also present. Venous sinuses: Within the limitations of contrast timing, no convincing thrombus. Anatomic variants: None significant Review of the MIP images confirms the above findings IMPRESSION: CTA neck: 1. Common and internal carotid arteries patent within the neck without hemodynamically significant stenosis. Mild soft and calcified plaque within the carotid bifurcations and proximal ICAs bilaterally. 2. Vertebral arteries patent within the neck. The left vertebral artery is strongly dominant. 60% narrowing of the left vertebral artery at the C5 level due to mass effect from cervical spine osteophytes. 3. Mild patchy ground-glass opacity within the right lung apex. This finding is nonspecific but may reflect edema or infection. Clinical correlation is recommended. 4. 7 mm right  upper lobe pulmonary nodule. Non-contrast chest CT at 6-12 months is recommended. If the nodule is stable at time of repeat CT, then future CT at 18-24 months (from today's scan) is considered optional for low-risk patients, but is recommended for high-risk patients. This recommendation follows the consensus statement: Guidelines for Management of  Incidental Pulmonary Nodules Detected on CT Images: From the Fleischner Society 2017; Radiology 2017; 284:228-243. CTA head: 1. The right vertebral artery remains developmentally diminutive intracranially. Superimposed severe focal stenosis within the proximal V4 segment of this vessel. 2. Elsewhere, no intracranial large vessel occlusion or proximal high-grade arterial stenosis. 3. Dolichoectasia of the intracranial anterior and posterior circulation suggestive of longstanding hypertension. Electronically Signed   By: Kellie Simmering DO   On: 09/11/2020 10:47   MR BRAIN WO CONTRAST  Result Date: 09/11/2020 CLINICAL DATA:  Neuro deficit, acute, stroke suspected EXAM: MRI HEAD WITHOUT CONTRAST TECHNIQUE: Multiplanar, multiecho pulse sequences of the brain and surrounding structures were obtained without intravenous contrast. COMPARISON:  09/11/2020. FINDINGS: Brain: No diffusion-weighted signal abnormality. No intracranial hemorrhage. No midline shift, ventriculomegaly or extra-axial fluid collection. No mass lesion. Mild cerebral atrophy with ex vacuo dilatation. Moderate chronic microvascular ischemic changes. Vascular: Major intracranial flow voids are proximally preserved. Skull and upper cervical spine: Normal marrow signal. Sinuses/Orbits: Sequela of bilateral lens replacement. Left maxillary sinus mucosal thickening. Small volume right mastoid free fluid. Other: None. IMPRESSION: No acute intracranial process. Mild cerebral atrophy and moderate chronic microvascular ischemic changes. Electronically Signed   By: Primitivo Gauze M.D.   On: 09/11/2020 16:34   MR CERVICAL SPINE WO CONTRAST  Result Date: 09/11/2020 CLINICAL DATA:  Neck pain since tooth removal 2 days prior EXAM: MRI CERVICAL SPINE WITHOUT CONTRAST TECHNIQUE: Multiplanar, multisequence MR imaging of the cervical spine was performed. No intravenous contrast was administered. COMPARISON:  None. FINDINGS: Motion artifact is present. Alignment:  Trace anterolisthesis at C4-C5. Vertebrae: Vertebral body heights are maintained apart from lower cervical mild degenerative endplate irregularity. Primarily chronic appearing degenerative endplate marrow changes are present. There is marrow edema at the right C6 posterior elements. Cord: Normal caliber and signal. Posterior Fossa, vertebral arteries, paraspinal tissues: Intracranial findings dictated separately. Otherwise unremarkable. Disc levels: C2-C3: Facet hypertrophy with fusion. No significant canal or foraminal stenosis. C3-C4: Trace disc bulge. Endplate osteophytes and facet and uncovertebral hypertrophy. No canal stenosis. Probable mild bilateral foraminal stenosis. C4-C5: Trace disc bulge. Endplate osteophytes and left greater than right facet and uncovertebral hypertrophy. No canal stenosis. Probable mild right and moderate left foraminal stenosis. C5-C6: Disc osteophyte complex. Uncovertebral greater than facet hypertrophy. Mild canal stenosis. Probable moderate foraminal stenosis. C6-C7: Disc osteophyte complex. Uncovertebral greater than facet hypertrophy. Mild canal stenosis. Probable mild to moderate foraminal stenosis. C7-T1: Disc bulge with endplate osteophytes and facet hypertrophy. Minor canal stenosis. Probable mild to moderate foraminal stenosis. IMPRESSION: Multilevel degenerative changes as detailed above. There is suboptimal stenosis evaluation due to motion artifact. No high-grade canal stenosis. Foraminal narrowing is greatest at C5-C6. Marrow edema associated with the right C6 posterior elements. No associated fracture on CT. May be on Archita Lomeli mechanical/degenerative basis or less likely reflect underlying lesion. Electronically Signed   By: Macy Mis M.D.   On: 09/11/2020 16:42   CT C-SPINE NO CHARGE  Result Date: 09/11/2020 CLINICAL DATA:  Cervicalgia EXAM: CT CERVICAL SPINE WITHOUT CONTRAST TECHNIQUE: Multidetector CT imaging of the cervical spine was performed without intravenous  contrast. Multiplanar CT image reconstructions were also generated. COMPARISON:  CT angiogram neck September 12, 2019; cervical spine CT September 06, 2010 FINDINGS: Alignment: There is stable 1 mm of anterolisthesis of C4 on C5. No other spondylolisthesis evident. Skull base and vertebrae: Skull base and craniocervical junction regions appear normal. There is no appreciable fracture. No blastic or lytic bone lesions are appreciable. Soft tissues and spinal canal: Prevertebral soft tissues and predental space regions are normal. No evident cord or canal hematoma. No paraspinous lesions are evident. Disc levels: There is severe disc space narrowing at C7-T1 with moderately severe disc space narrowing at C5-6 and C6-7. There is milder disc space narrowing at C2-3 and C3-4. At C2-3, there is moderate facet hypertrophy bilaterally. No nerve root edema or effacement. No disc extrusion or stenosis. At C3-4, there is facet hypertrophy bilaterally, more severe on the left than on the right. There is slight impression on the exiting nerve root at C3-4 on the left due to bony hypertrophy. No disc extrusion or stenosis. At C4-5, there is facet hypertrophy which is marked on the left and moderate on the right. No nerve root edema or effacement. Mild impression on the exiting nerve root on the left at C4-5 is noted. At C5-6, there is moderate facet hypertrophy bilaterally. Facet hypertrophy bilaterally with stent symmetric impression on exiting nerve roots bilaterally. There is left paracentral disc protrusion without spinal stenosis. There is no frank disc extrusion. At C6-7, there is moderate facet hypertrophy bilaterally. There is impression on exiting nerve roots due to bony hypertrophy bilaterally. No frank disc extrusion or stenosis. At C7-T1, there is moderate facet hypertrophy bilaterally. There is mild impression on exiting nerve roots bilaterally due to bony hypertrophy. No frank disc extrusion or stenosis. Upper chest:  There is mild upper lobe atelectatic change. No edema or airspace opacity. Other: None IMPRESSION: 1. Multilevel osteoarthritic change. Exit foraminal narrowing with impression on several exiting nerve roots due to bony hypertrophy as noted. No frank disc extrusion or stenosis. 2. No evident fracture. 3. 1 mm of anterolisthesis of C4 on C5 is stable. No new spondylolisthesis. Electronically Signed   By: Lowella Grip III M.D.   On: 09/11/2020 10:53   DG Chest Portable 1 View  Result Date: 09/11/2020 CLINICAL DATA:  Altered mental status EXAM: PORTABLE CHEST 1 VIEW COMPARISON:  None FINDINGS: Lungs are clear. Heart is borderline prominent with pulmonary vascularity normal. No adenopathy. No bone lesions. IMPRESSION: Borderline cardiac prominence.  No edema or consolidation. Electronically Signed   By: Lowella Grip III M.D.   On: 09/11/2020 10:54    Microbiology: Recent Results (from the past 240 hour(s))  Resp Panel by RT-PCR (Flu Criss Bartles&B, Covid) Nasopharyngeal Swab     Status: None   Collection Time: 09/11/20  7:52 AM   Specimen: Nasopharyngeal Swab; Nasopharyngeal(NP) swabs in vial transport medium  Result Value Ref Range Status   SARS Coronavirus 2 by RT PCR NEGATIVE NEGATIVE Final    Comment: (NOTE) SARS-CoV-2 target nucleic acids are NOT DETECTED.  The SARS-CoV-2 RNA is generally detectable in upper respiratory specimens during the acute phase of infection. The lowest concentration of SARS-CoV-2 viral copies this assay can detect is 138 copies/mL. Mihir Flanigan negative result does not preclude SARS-Cov-2 infection and should not be used as the sole basis for treatment or other patient management decisions. Draken Farrior negative result may occur with  improper specimen collection/handling, submission of specimen other than nasopharyngeal swab, presence of viral mutation(s) within the areas targeted by this assay, and inadequate number of viral copies(<138 copies/mL). Emireth Cockerham negative result must be  combined  with clinical observations, patient history, and epidemiological information. The expected result is Negative.  Fact Sheet for Patients:  EntrepreneurPulse.com.au  Fact Sheet for Healthcare Providers:  IncredibleEmployment.be  This test is no t yet approved or cleared by the [place] FDA and  has been authorized for detection and/or diagnosis of SARS-CoV-2 by FDA under an Emergency Use Authorization (EUA). This EUA will remain  in effect (meaning this test can be used) for the duration of the COVID-19 declaration under Section 564(b)(1) of the Act, 21 U.S.C.section 360bbb-3(b)(1), unless the authorization is terminated  or revoked sooner.       Influenza Malaiah Viramontes by PCR NEGATIVE NEGATIVE Final   Influenza B by PCR NEGATIVE NEGATIVE Final    Comment: (NOTE) The Xpert Xpress SARS-CoV-2/FLU/RSV plus assay is intended as an aid in the diagnosis of influenza from Nasopharyngeal swab specimens and should not be used as Jamaree Hosier sole basis for treatment. Nasal washings and aspirates are unacceptable for Xpert Xpress SARS-CoV-2/FLU/RSV testing.  Fact Sheet for Patients: EntrepreneurPulse.com.au  Fact Sheet for Healthcare Providers: IncredibleEmployment.be  This test is not yet approved or cleared by the [place] FDA and has been authorized for detection and/or diagnosis of SARS-CoV-2 by FDA under an Emergency Use Authorization (EUA). This EUA will remain in effect (meaning this test can be used) for the duration of the COVID-19 declaration under Section 564(b)(1) of the Act, 21 U.S.C. section 360bbb-3(b)(1), unless the authorization is terminated or revoked.  Performed at [facility], [place] [address] [place], [place] [address]   Urine culture     Status: Abnormal   Collection Time: 09/11/20 11:25 AM   Specimen: Urine, Random  Result Value Ref Range Status   Specimen Description URINE, RANDOM  Final    Special Requests NONE  Final   Culture (Jackqulyn Mendel)  Final    40,000 COLONIES/mL STREPTOCOCCUS AGALACTIAE TESTING AGAINST S. AGALACTIAE NOT ROUTINELY PERFORMED DUE TO PREDICTABILITY OF AMP/PEN/VAN SUSCEPTIBILITY. Performed at [facility], [place] [address], [place], [place] [address]    Report Status 09/12/2020 FINAL  Final     Labs: Basic Metabolic Panel: Recent Labs  Lab 09/11/20 0648 09/11/20 0724  NA 136 139  K 3.7 4.0  CL 99 103  CO2 25  --   GLUCOSE 138* 130*  BUN 27* 34*  CREATININE 1.12* 1.00  CALCIUM 9.5  --    Liver Function Tests: Recent Labs  Lab 09/11/20 0648  AST 26  ALT 60*  ALKPHOS 122  BILITOT 0.4  PROT 7.0  ALBUMIN 3.8   No results for input(s): LIPASE, AMYLASE in the last 168 hours. Recent Labs  Lab 09/11/20 0656  AMMONIA 31   CBC: Recent Labs  Lab 09/11/20 0648 09/11/20 0724  WBC 11.2*  --   NEUTROABS 8.7*  --   HGB 12.7 13.6  HCT 40.9 40.0  MCV 85.9  --   PLT 216  --    Cardiac Enzymes: No results for input(s): CKTOTAL, CKMB, CKMBINDEX, TROPONINI in the last 168 hours. BNP: BNP (last 3 results) No results for input(s): BNP in the last 8760 hours.  ProBNP (last 3 results) No results for input(s): PROBNP in the last 8760 hours.  CBG: No results for input(s): GLUCAP in the last 168 hours.     Signed:  Fayrene Helper MD.  [facility] 09/12/2020, 11:52 AM

## 2020-09-12 NOTE — Plan of Care (Signed)
  Problem: Education: Goal: Knowledge of General Education information will improve Description: Including pain rating scale, medication(s)/side effects and non-pharmacologic comfort measures Outcome: Adequate for Discharge   Problem: Health Behavior/Discharge Planning: Goal: Ability to manage health-related needs will improve Outcome: Adequate for Discharge   Problem: Clinical Measurements: Goal: Ability to maintain clinical measurements within normal limits will improve Outcome: Adequate for Discharge Goal: Will remain free from infection Outcome: Adequate for Discharge Goal: Diagnostic test results will improve Outcome: Adequate for Discharge Goal: Respiratory complications will improve Outcome: Adequate for Discharge Goal: Cardiovascular complication will be avoided Outcome: Adequate for Discharge   Problem: Activity: Goal: Risk for activity intolerance will decrease Outcome: Adequate for Discharge   Problem: Nutrition: Goal: Adequate nutrition will be maintained Outcome: Adequate for Discharge   Problem: Coping: Goal: Level of anxiety will decrease Outcome: Adequate for Discharge   Problem: Elimination: Goal: Will not experience complications related to bowel motility Outcome: Adequate for Discharge Goal: Will not experience complications related to urinary retention Outcome: Adequate for Discharge   Problem: Pain Managment: Goal: General experience of comfort will improve Outcome: Adequate for Discharge   Problem: Safety: Goal: Ability to remain free from injury will improve Outcome: Adequate for Discharge   Problem: Skin Integrity: Goal: Risk for impaired skin integrity will decrease Outcome: Adequate for Discharge   Problem: Education: Goal: Knowledge of disease or condition will improve Outcome: Adequate for Discharge Goal: Knowledge of secondary prevention will improve Outcome: Adequate for Discharge Goal: Knowledge of patient specific risk factors  addressed and post discharge goals established will improve Outcome: Adequate for Discharge Goal: Individualized Educational Video(s) Outcome: Adequate for Discharge   Problem: Coping: Goal: Will verbalize positive feelings about self Outcome: Adequate for Discharge Goal: Will identify appropriate support needs Outcome: Adequate for Discharge   Problem: Health Behavior/Discharge Planning: Goal: Ability to manage health-related needs will improve Outcome: Adequate for Discharge   Problem: Self-Care: Goal: Ability to participate in self-care as condition permits will improve Outcome: Adequate for Discharge Goal: Verbalization of feelings and concerns over difficulty with self-care will improve Outcome: Adequate for Discharge Goal: Ability to communicate needs accurately will improve Outcome: Adequate for Discharge   Problem: Ischemic Stroke/TIA Tissue Perfusion: Goal: Complications of ischemic stroke/TIA will be minimized Outcome: Adequate for Discharge

## 2020-09-12 NOTE — Evaluation (Signed)
Physical Therapy Evaluation Patient Details Name: Anna Acevedo MRN: [id number] DOB: 1935-07-10 Today's Date: 09/12/2020   History of Present Illness  86 y.o. female with medical history significant of HTN, GERD, HLD, sciatica on chronic on chronic Tylenol, presented with acute onset of confusion. Patient had a tooth removal on 09/09/2020, with irritation and bleeding at gum site since. Imaging negative for CVA.  Clinical Impression  Pt presents to PT with significant deficits in cognition, awareness, short term memory. Pt is likely near her baseline for mobility but does have some chronic balance deficits, utilizing a cane for community mobility. Pt has difficulty recalling room number after PT provides information multiple times during session. Pt is unable to find room after ambulating in hallway despite her spouse standing at door. Pt will benefit from continued acute PT POC to improve balance and continue to work on path finding tasks. PT recommends discharge home with 24/7 supervision of family. No current PT follow-up needs.    Follow Up Recommendations No PT follow up;Supervision for mobility/OOB    Equipment Recommendations  None recommended by PT    Recommendations for Other Services       Precautions / Restrictions Precautions Precautions: Fall Restrictions Weight Bearing Restrictions: No      Mobility  Bed Mobility Overal bed mobility: Modified Independent             General bed mobility comments: increased time    Transfers Overall transfer level: Needs assistance Equipment used: None Transfers: Sit to/from Stand Sit to Stand: Supervision            Ambulation/Gait Ambulation/Gait assistance: Supervision Gait Distance (Feet): 250 Feet Assistive device: None Gait Pattern/deviations: Step-through pattern Gait velocity: reduced Gait velocity interpretation: <1.8 ft/sec, indicate of risk for recurrent falls General Gait Details: slowed  step-through gait, reduced stride length, pt does bump into one object on R side  Stairs            Wheelchair Mobility    Modified Rankin (Stroke Patients Only)       Balance Overall balance assessment: Needs assistance Sitting-balance support: No upper extremity supported;Feet supported Sitting balance-Leahy Scale: Good     Standing balance support: No upper extremity supported;During functional activity Standing balance-Leahy Scale: Good                               Pertinent Vitals/Pain Pain Assessment: 0-10 Pain Score: 5  Pain Location: back and site of tooth extraction Pain Descriptors / Indicators: Aching Pain Intervention(s): Monitored during session    Home Living Family/patient expects to be discharged to:: Private residence Living Arrangements: Spouse/significant other Available Help at Discharge: Family;Available 24 hours/day Type of Home: House Home Access: Stairs to enter Entrance Stairs-Rails: Left Entrance Stairs-Number of Steps: 7 Home Layout: One level Home Equipment: Cane - single point;Walker - 4 wheels;Shower seat      Prior Function Level of Independence: Independent with assistive device(s)         Comments: pt utilizes SPC for community mobility, driving     Hand Dominance        Extremity/Trunk Assessment   Upper Extremity Assessment Upper Extremity Assessment: Overall WFL for tasks assessed    Lower Extremity Assessment Lower Extremity Assessment: Overall WFL for tasks assessed    Cervical / Trunk Assessment Cervical / Trunk Assessment: Normal  Communication   Communication: No difficulties  Cognition Arousal/Alertness: Awake/alert Behavior During Therapy: Impulsive Overall Cognitive  Status: Impaired/Different from baseline Area of Impairment: Orientation;Attention;Memory;Following commands;Safety/judgement;Awareness;Problem solving                 Orientation Level: Disoriented to;Time (pt  requires 2 attempts at month and 3 attempts at year) Current Attention Level: Sustained Memory: Decreased short-term memory Following Commands: Follows one step commands consistently Safety/Judgement: Decreased awareness of deficits Awareness: Intellectual Problem Solving: Difficulty sequencing        General Comments General comments (skin integrity, edema, etc.): VSS on RA    Exercises     Assessment/Plan    PT Assessment Patient needs continued PT services  PT Problem List Decreased cognition;Decreased balance;Decreased safety awareness       PT Treatment Interventions DME instruction;Stair training;Functional mobility training;Therapeutic activities;Balance training;Patient/family education    PT Goals (Current goals can be found in the Care Plan section)  Acute Rehab PT Goals Patient Stated Goal: to go home PT Goal Formulation: With patient/family Time For Goal Achievement: 09/26/20 Potential to Achieve Goals: Good    Frequency Min 3X/week   Barriers to discharge        Co-evaluation               AM-PAC PT "6 Clicks" Mobility  Outcome Measure Help needed turning from your back to your side while in a flat bed without using bedrails?: None Help needed moving from lying on your back to sitting on the side of a flat bed without using bedrails?: None Help needed moving to and from a bed to a chair (including a wheelchair)?: A Little Help needed standing up from a chair using your arms (e.g., wheelchair or bedside chair)?: A Little Help needed to walk in hospital room?: A Little Help needed climbing 3-5 steps with a railing? : A Little 6 Click Score: 20    End of Session Equipment Utilized During Treatment: Gait belt Activity Tolerance: Patient tolerated treatment well Patient left: in chair;with call bell/phone within reach;with chair alarm set;with family/visitor present Nurse Communication: Mobility status PT Visit Diagnosis: Other abnormalities of  gait and mobility (R26.89)    Time: 2919-1660 PT Time Calculation (min) (ACUTE ONLY): 30 min   Charges:   PT Evaluation $PT Eval Low Complexity: [place], PT, DPT Acute Rehabilitation Pager: [phone]   Zenaida Niece 09/12/2020, 9:20 AM

## 2020-09-15 ENCOUNTER — Telehealth: Payer: Self-pay

## 2020-09-15 DIAGNOSIS — H04123 Dry eye syndrome of bilateral lacrimal glands: Secondary | ICD-10-CM | POA: Diagnosis not present

## 2020-09-15 DIAGNOSIS — H35033 Hypertensive retinopathy, bilateral: Secondary | ICD-10-CM | POA: Diagnosis not present

## 2020-09-15 DIAGNOSIS — D23111 Other benign neoplasm of skin of right upper eyelid, including canthus: Secondary | ICD-10-CM | POA: Diagnosis not present

## 2020-09-15 NOTE — Telephone Encounter (Signed)
Pt. Showed up on my TOC report was recently in the hospital for altered mental status, I did not see were it stated if she needed to f/u here or not. If you would like her to f/u here let me know and I will get her scheduled.

## 2020-09-15 NOTE — Telephone Encounter (Signed)
Schedule her to come in Thursday or Friday

## 2020-09-16 NOTE — Telephone Encounter (Signed)
Pt. Scheduled tomorrow at 11:45.

## 2020-09-16 NOTE — Telephone Encounter (Signed)
11:45 will be fine.

## 2020-09-16 NOTE — Telephone Encounter (Signed)
You don't have any 30 minute slots, I can schedule her tomorrow at 11:45 or if you are working Friday afternoon I can schedule her at 1:30 in a 30 minute slot.

## 2020-09-17 ENCOUNTER — Other Ambulatory Visit: Payer: Self-pay

## 2020-09-17 ENCOUNTER — Ambulatory Visit (INDEPENDENT_AMBULATORY_CARE_PROVIDER_SITE_OTHER): Payer: Medicare Other | Admitting: Family Medicine

## 2020-09-17 ENCOUNTER — Encounter: Payer: Self-pay | Admitting: Family Medicine

## 2020-09-17 VITALS — BP 130/82 | HR 57 | Temp 96.9°F | Wt 172.0 lb

## 2020-09-17 DIAGNOSIS — I10 Essential (primary) hypertension: Secondary | ICD-10-CM

## 2020-09-17 DIAGNOSIS — R404 Transient alteration of awareness: Secondary | ICD-10-CM | POA: Diagnosis not present

## 2020-09-17 LAB — METHYLMALONIC ACID, SERUM: Methylmalonic Acid, Quantitative: 577 nmol/L — ABNORMAL HIGH (ref 0–378)

## 2020-09-17 NOTE — Progress Notes (Signed)
   Subjective:    Patient ID: Anna Acevedo, female    DOB: 17-Jun-1935, 86 y.o.   MRN: [id number]  HPI She is here for recheck after recent emergency room visit and hospitalization for treatment of altered mental status.  She was in the hospital for 1 day however they did give her an extensive work-up including blood work, x-rays and MRI.  The work-up was essentially negative for cause of the mental status but did find a pulmonary nodule that will need to be followed up on and other incidental findings.  Presently she is having no headache, nausea, vomiting, blurred or double vision, weakness or mental status changes.  Her husband agrees with that.  She was found to be B12 deficient and placed on supplementation.   Review of Systems     Objective:   Physical Exam Alert and in no distress.  Oriented x3.  Cardiac exam shows regular rhythm without murmurs gallops.  Lungs clear to auscultation.  DTRs are normal. The hospital ER visit and discharge summary as well as lab and x-rays was reviewed.     Assessment & Plan:  Transient alteration of awareness - Plan: CBC with Differential/Platelet, Comprehensive metabolic panel  Benign essential hypertension At this point she will continue on her present medications.  If she has further difficulty with mental status he is instructed to come and get reevaluated.  32 minutes spent in counseling and coordination of care.

## 2020-09-18 LAB — CBC WITH DIFFERENTIAL/PLATELET
Basophils Absolute: 0.1 10*3/uL (ref 0.0–0.2)
Basos: 1 %
EOS (ABSOLUTE): 0.6 10*3/uL — ABNORMAL HIGH (ref 0.0–0.4)
Eos: 6 %
Hematocrit: 39 % (ref 34.0–46.6)
Hemoglobin: 12.6 g/dL (ref 11.1–15.9)
Immature Grans (Abs): 0.1 10*3/uL (ref 0.0–0.1)
Immature Granulocytes: 1 %
Lymphocytes Absolute: 1.7 10*3/uL (ref 0.7–3.1)
Lymphs: 18 %
MCH: 27.2 pg (ref 26.6–33.0)
MCHC: 32.3 g/dL (ref 31.5–35.7)
MCV: 84 fL (ref 79–97)
Monocytes Absolute: 0.9 10*3/uL (ref 0.1–0.9)
Monocytes: 10 %
Neutrophils Absolute: 6.2 10*3/uL (ref 1.4–7.0)
Neutrophils: 64 %
Platelets: 298 10*3/uL (ref 150–450)
RBC: 4.64 x10E6/uL (ref 3.77–5.28)
RDW: 14.7 % (ref 11.7–15.4)
WBC: 9.6 10*3/uL (ref 3.4–10.8)

## 2020-09-18 LAB — COMPREHENSIVE METABOLIC PANEL
ALT: 21 IU/L (ref 0–32)
AST: 17 IU/L (ref 0–40)
Albumin/Globulin Ratio: 1.7 (ref 1.2–2.2)
Albumin: 4.2 g/dL (ref 3.6–4.6)
Alkaline Phosphatase: 111 IU/L (ref 44–121)
BUN/Creatinine Ratio: 14 (ref 12–28)
BUN: 12 mg/dL (ref 8–27)
Bilirubin Total: 0.3 mg/dL (ref 0.0–1.2)
CO2: 27 mmol/L (ref 20–29)
Calcium: 10.2 mg/dL (ref 8.7–10.3)
Chloride: 99 mmol/L (ref 96–106)
Creatinine, Ser: 0.85 mg/dL (ref 0.57–1.00)
Globulin, Total: 2.5 g/dL (ref 1.5–4.5)
Glucose: 83 mg/dL (ref 65–99)
Potassium: 5.1 mmol/L (ref 3.5–5.2)
Sodium: 139 mmol/L (ref 134–144)
Total Protein: 6.7 g/dL (ref 6.0–8.5)
eGFR: 68 mL/min/{1.73_m2} (ref >59)

## 2020-10-01 ENCOUNTER — Inpatient Hospital Stay: Payer: Medicare Other | Admitting: Family Medicine

## 2020-10-14 ENCOUNTER — Ambulatory Visit: Payer: Medicare Other | Admitting: Family Medicine

## 2020-11-10 ENCOUNTER — Other Ambulatory Visit: Payer: Self-pay

## 2020-11-10 ENCOUNTER — Ambulatory Visit (INDEPENDENT_AMBULATORY_CARE_PROVIDER_SITE_OTHER): Payer: Medicare Other | Admitting: Family Medicine

## 2020-11-10 ENCOUNTER — Encounter: Payer: Self-pay | Admitting: Family Medicine

## 2020-11-10 VITALS — BP 140/80 | HR 52 | Temp 97.3°F | Ht 62.0 in | Wt 176.2 lb

## 2020-11-10 DIAGNOSIS — R404 Transient alteration of awareness: Secondary | ICD-10-CM | POA: Diagnosis not present

## 2020-11-10 DIAGNOSIS — M8949 Other hypertrophic osteoarthropathy, multiple sites: Secondary | ICD-10-CM

## 2020-11-10 DIAGNOSIS — Z0000 Encounter for general adult medical examination without abnormal findings: Secondary | ICD-10-CM | POA: Diagnosis not present

## 2020-11-10 DIAGNOSIS — M8580 Other specified disorders of bone density and structure, unspecified site: Secondary | ICD-10-CM | POA: Diagnosis not present

## 2020-11-10 DIAGNOSIS — M159 Polyosteoarthritis, unspecified: Secondary | ICD-10-CM

## 2020-11-10 DIAGNOSIS — E785 Hyperlipidemia, unspecified: Secondary | ICD-10-CM

## 2020-11-10 DIAGNOSIS — I10 Essential (primary) hypertension: Secondary | ICD-10-CM | POA: Diagnosis not present

## 2020-11-10 DIAGNOSIS — E669 Obesity, unspecified: Secondary | ICD-10-CM | POA: Insufficient documentation

## 2020-11-10 MED ORDER — LOSARTAN POTASSIUM-HCTZ 50-12.5 MG PO TABS: 0.5000 | ORAL_TABLET | Freq: Every morning | ORAL | 3 refills | Status: DC

## 2020-11-10 MED ORDER — PRAVASTATIN SODIUM 40 MG PO TABS: 40.0000 mg | ORAL_TABLET | Freq: Every morning | ORAL | 3 refills | Status: DC

## 2020-11-10 NOTE — Patient Instructions (Signed)
  Anna Acevedo , Thank you for taking time to come for your Medicare Wellness Visit. I appreciate your ongoing commitment to your health goals. Please review the following plan we discussed and let me know if I can assist you in the future.   These are the goals we discussed: Take a good multivitamin but also extra vitamin D and calcium.  You can also take extra vitamin B12 This is a list of the screening recommended for you and due dates:  Health Maintenance  Topic Date Due  . Flu Shot  02/15/2021  . Tetanus Vaccine  10/04/2027  . DEXA scan (bone density measurement)  Completed  . COVID-19 Vaccine  Completed  . Pneumonia vaccines  Completed  . HPV Vaccine  Aged Out

## 2020-11-10 NOTE — Progress Notes (Signed)
Anna Acevedo is a 86 y.o. female who presents for annual wellness visit,CPE and follow-up on chronic medical conditions.  She had an episode of altered mental status and was admitted to the hospital.  The work-up in the hospital was negative.  I did review the hospital admission as well as discharge PE.  Her vitamin B12 level was slightly low and they recommended B12 supplementation.  Presently she is having no mental status changes.  She continues on metoprolol as well as losartan/HCTZ.  She does have a previous history of palpitations but has not had any symptoms in several years.  She continues on the Pravachol and is having no difficulty with that.  She does have a DEXA scan showing osteopenia.  Does complain of generalized arthritis but not enough to cause any major difficulties.  Otherwise she has no particular concerns or complaints.  Immunizations and Health Maintenance Immunization History  Administered Date(s) Administered  . DTaP 04/27/2004  . Influenza Whole 06/03/2011  . PFIZER(Purple Top)SARS-COV-2 Vaccination 09/19/2019, 10/16/2019, 06/01/2020  . Pneumococcal Conjugate-13 05/23/2007  . Pneumococcal Polysaccharide-23 02/11/2013  . Tdap 07/19/2007, 10/03/2017  . Zoster 07/15/2008   There are no preventive care reminders to display for this patient.  Last Pap smear: aged out  Last mammogram:  05/27/13 Last colonoscopy: 06/25/2008 Last DEXA:12/03/19 Dentist: Q six months  Ophtho: Q year Exercise: walking 3 days a week  Other doctors caring for patient include: Anna Acevedo cardio  Advanced directives: Does Patient Have a Medical Advance Directive?: Yes Type of Advance Directive: Living will Does patient want to make changes to medical advance directive?: No - Patient declined  Depression screen:  See questionnaire below.  Depression screen [facility] 2/9 11/10/2020 10/14/2019 10/10/2018 10/03/2016 09/19/2011  Decreased Interest 0 0 0 0 0  Down, Depressed, Hopeless 0 0 0 0 0  PHQ - 2  Score 0 0 0 0 0    Fall Risk Screen: see questionnaire below. Fall Risk  11/10/2020 10/14/2019 10/10/2018 10/03/2016  Falls in the past year? 0 0 0 No  Number falls in past yr: 0 - - -  Injury with Fall? 0 - - -  Risk for fall due to : No Fall Risks - - -  Follow up Falls evaluation completed - - -    ADL screen:  See questionnaire below Functional Status Survey: Is the patient deaf or have difficulty hearing?: No Does the patient have difficulty seeing, even when wearing glasses/contacts?: No Does the patient have difficulty concentrating, remembering, or making decisions?: No Does the patient have difficulty walking or climbing stairs?: Yes Does the patient have difficulty dressing or bathing?: No Does the patient have difficulty doing errands alone such as visiting a doctor's office or shopping?: No   Review of Systems Constitutional: -, -unexpected weight change, -anorexia, -fatigue Allergy: -sneezing, -itching, -congestion Dermatology: denies changing moles, rash, lumps ENT: -runny nose, -ear pain, -sore throat,  Cardiology:  -chest pain, -palpitations, -orthopnea, Respiratory: -cough, -shortness of breath, -dyspnea on exertion, -wheezing,  Gastroenterology: -abdominal pain, -nausea, -vomiting, -diarrhea, -constipation, -dysphagia Hematology: -bleeding or bruising problems Musculoskeletal: -arthralgias, -myalgias, -joint swelling, -back pain, - Ophthalmology: -vision changes,  Urology: -dysuria, -difficulty urinating,  -urinary frequency, -urgency, incontinence Neurology: -, -numbness, , -memory loss, -falls, -dizziness  Recent EKG was reviewed by me.  Also the discharge summary was reviewed by me. PHYSICAL EXAM:  General Appearance: Alert, cooperative, no distress, appears stated age Head: Normocephalic, without obvious abnormality, atraumatic Eyes: PERRL, conjunctiva/corneas clear, EOM's intact,  Ears:  Normal TM's and external ear canals Nose: Nares normal, mucosa  normal, no drainage or sinus tenderness Throat: Lips, mucosa, and tongue normal; teeth and gums normal Neck: Supple, no lymphadenopathy;  thyroid:  no enlargement/tenderness/nodules; no carotid bruit or JVD Lungs: Clear to auscultation bilaterally without wheezes, rales or ronchi; respirations unlabored Heart: Regular rate and rhythm, S1 and S2 normal, no murmur, rubor gallop Abdomen: Soft, non-tender, nondistended, normoactive bowel sounds,  no masses, no hepatosplenomegaly Extremities: No clubbing, cyanosis or edema Skin:  Skin color, texture, turgor normal, no rashes or lesions Lymph nodes: Cervical, supraclavicular, and axillary nodes normal Neurologic:  CNII-XII intact, normal strength, sensation and gait; reflexes 2+ and symmetric throughout Psych: Normal mood, affect, hygiene and grooming.  ASSESSMENT/PLAN: Routine general medical examination at a health care facility - Plan: CBC with Differential/Platelet, Comprehensive metabolic panel  Primary osteoarthritis involving multiple joints  Benign essential hypertension  Transient alteration of awareness - Plan: CBC with Differential/Platelet, Comprehensive metabolic panel  Osteopenia, unspecified location  Hyperlipidemia, unspecified hyperlipidemia type Recommend multivitamin, extra vitamin D, calcium and extra vitamin B12.  Recommend Tylenol for aches and pains.  Otherwise continue on present medication regimen.  Discussed follow-up with cardiology but since she is not having any palpitations she is considering just following up with me concerning this.  at least 30 minutes of aerobic activity at least 5 days/week and weight-bearing exercise 2x/week; proper sunscreen use reviewed; healthy diet, including goals of calcium and vitamin D intake  regular seatbelt use;   Immunization recommendations discussed. Need Shingrix and she will check with her pharmacy concerning this.  Colonoscopy recommendations reviewed   Medicare  Attestation I have personally reviewed: The patient's medical and social history Their use of alcohol, tobacco or illicit drugs Their current medications and supplements The patient's functional ability including ADLs,fall risks, home safety risks, cognitive, and hearing and visual impairment Diet and physical activities Evidence for depression or mood disorders  The patient's weight, height, and BMI have been recorded in the chart.  I have made referrals, counseling, and provided education to the patient based on review of the above and I have provided the patient with a written personalized care plan for preventive services.     Jill Alexanders, MD   11/10/2020

## 2020-11-11 LAB — COMPREHENSIVE METABOLIC PANEL
ALT: 12 IU/L (ref 0–32)
AST: 11 IU/L (ref 0–40)
Albumin/Globulin Ratio: 1.5 (ref 1.2–2.2)
Albumin: 4.3 g/dL (ref 3.6–4.6)
Alkaline Phosphatase: 72 IU/L (ref 44–121)
BUN/Creatinine Ratio: 13 (ref 12–28)
BUN: 14 mg/dL (ref 8–27)
Bilirubin Total: 0.3 mg/dL (ref 0.0–1.2)
CO2: 27 mmol/L (ref 20–29)
Calcium: 10.2 mg/dL (ref 8.7–10.3)
Chloride: 104 mmol/L (ref 96–106)
Creatinine, Ser: 1.04 mg/dL — ABNORMAL HIGH (ref 0.57–1.00)
Globulin, Total: 2.8 g/dL (ref 1.5–4.5)
Glucose: 98 mg/dL (ref 65–99)
Potassium: 4.7 mmol/L (ref 3.5–5.2)
Sodium: 143 mmol/L (ref 134–144)
Total Protein: 7.1 g/dL (ref 6.0–8.5)
eGFR: 53 mL/min/{1.73_m2} — ABNORMAL LOW (ref >59)

## 2020-11-11 LAB — CBC WITH DIFFERENTIAL/PLATELET
Basophils Absolute: 0.1 10*3/uL (ref 0.0–0.2)
Basos: 1 %
EOS (ABSOLUTE): 0.5 10*3/uL — ABNORMAL HIGH (ref 0.0–0.4)
Eos: 6 %
Hematocrit: 41.4 % (ref 34.0–46.6)
Hemoglobin: 13.5 g/dL (ref 11.1–15.9)
Immature Grans (Abs): 0.1 10*3/uL (ref 0.0–0.1)
Immature Granulocytes: 1 %
Lymphocytes Absolute: 1.6 10*3/uL (ref 0.7–3.1)
Lymphs: 20 %
MCH: 27 pg (ref 26.6–33.0)
MCHC: 32.6 g/dL (ref 31.5–35.7)
MCV: 83 fL (ref 79–97)
Monocytes Absolute: 0.6 10*3/uL (ref 0.1–0.9)
Monocytes: 7 %
Neutrophils Absolute: 5.4 10*3/uL (ref 1.4–7.0)
Neutrophils: 65 %
Platelets: 209 10*3/uL (ref 150–450)
RBC: 5 x10E6/uL (ref 3.77–5.28)
RDW: 14.2 % (ref 11.7–15.4)
WBC: 8.2 10*3/uL (ref 3.4–10.8)

## 2021-02-23 ENCOUNTER — Telehealth: Payer: Self-pay | Admitting: Family Medicine

## 2021-02-23 NOTE — Telephone Encounter (Signed)
Pt called and states that she was exposed to [place] on Friday. She tested today and is positive. She states she has a cough and runny nose. She does not have a fever and doesn't feel bad. Pt was advised to follow CDC guidelines and call back if symptoms worsen. Pt was advised I would advise JCL and if he had any other recommendations we would call her back. Pt can be reached at [phone].

## 2021-02-23 NOTE — Telephone Encounter (Signed)
Pt was advised kH

## 2021-05-10 ENCOUNTER — Encounter: Payer: Medicare Other | Admitting: Family Medicine

## 2021-05-18 ENCOUNTER — Ambulatory Visit (INDEPENDENT_AMBULATORY_CARE_PROVIDER_SITE_OTHER): Payer: Medicare Other | Admitting: Family Medicine

## 2021-05-18 ENCOUNTER — Encounter: Payer: Self-pay | Admitting: Family Medicine

## 2021-05-18 ENCOUNTER — Other Ambulatory Visit: Payer: Self-pay

## 2021-05-18 VITALS — BP 132/78 | HR 86 | Wt 177.4 lb

## 2021-05-18 DIAGNOSIS — M159 Polyosteoarthritis, unspecified: Secondary | ICD-10-CM | POA: Diagnosis not present

## 2021-05-18 DIAGNOSIS — E538 Deficiency of other specified B group vitamins: Secondary | ICD-10-CM | POA: Diagnosis not present

## 2021-05-18 DIAGNOSIS — Z23 Encounter for immunization: Secondary | ICD-10-CM | POA: Diagnosis not present

## 2021-05-18 DIAGNOSIS — M8580 Other specified disorders of bone density and structure, unspecified site: Secondary | ICD-10-CM | POA: Diagnosis not present

## 2021-05-18 DIAGNOSIS — I10 Essential (primary) hypertension: Secondary | ICD-10-CM

## 2021-05-18 DIAGNOSIS — I119 Hypertensive heart disease without heart failure: Secondary | ICD-10-CM

## 2021-05-18 DIAGNOSIS — E782 Mixed hyperlipidemia: Secondary | ICD-10-CM | POA: Diagnosis not present

## 2021-05-18 DIAGNOSIS — E669 Obesity, unspecified: Secondary | ICD-10-CM

## 2021-05-18 NOTE — Progress Notes (Signed)
   Subjective:    Patient ID: Anna Acevedo, female    DOB: 19-Jun-1935, 86 y.o.   MRN: [id number]  HPI She is here for an interval evaluation.  She continues on her blood pressure medication and is having no difficulty with that.  Recent hospitalization did show a low vitamin B12 level.  She has been taking B12 supplementation.  She is also on a multivitamin. Her last admission was for altered mental status.  That apparently was related to dehydration.  She has had no more cognitive difficulty since then.  She does complain of arthritic symptoms however is not having any major difficulties with that.  She does have osteopenia and is taking extra vitamin D and calcium.  Lab data does show elevated lipid panel.  Review of Systems     Objective:   Physical Exam Alert and in no distress. Tympanic membranes and canals are normal. Pharyngeal area is normal. Neck is supple without adenopathy or thyromegaly. Cardiac exam shows a regular sinus rhythm without murmurs or gallops. Lungs are clear to auscultation.        Assessment & Plan:  Benign hypertensive heart disease without heart failure  Mixed hyperlipidemia  Primary osteoarthritis involving multiple joints  Osteopenia, unspecified location  Benign essential hypertension  Obesity (BMI 30.0-34.9)  Immunization, viral disease - Plan: Pension scheme manager  Low vitamin B12 level - Plan: Vitamin B12 I will check a B12 level.  Recommend she continue on her present medication regimen and follow-up here in roughly 6 months.  At that time I will probably order another DEXA scan.  Encouraged her to be as physically active as possible.

## 2021-05-19 LAB — VITAMIN B12: Vitamin B-12: >2000 pg/mL — ABNORMAL HIGH (ref 232–1245)

## 2021-07-13 ENCOUNTER — Telehealth (INDEPENDENT_AMBULATORY_CARE_PROVIDER_SITE_OTHER): Payer: Medicare Other | Admitting: Medical

## 2021-07-13 ENCOUNTER — Encounter: Payer: Self-pay | Admitting: Medical

## 2021-07-13 ENCOUNTER — Other Ambulatory Visit: Payer: Self-pay

## 2021-07-13 VITALS — Wt 177.0 lb

## 2021-07-13 DIAGNOSIS — J988 Other specified respiratory disorders: Secondary | ICD-10-CM

## 2021-07-13 DIAGNOSIS — J3489 Other specified disorders of nose and nasal sinuses: Secondary | ICD-10-CM | POA: Diagnosis not present

## 2021-07-13 MED ORDER — BENZONATATE 200 MG PO CAPS: 200.0000 mg | ORAL_CAPSULE | Freq: Three times a day (TID) | ORAL | 0 refills | Status: DC | PRN

## 2021-07-13 MED ORDER — AZITHROMYCIN 250 MG PO TABS: ORAL_TABLET | ORAL | 0 refills | Status: DC

## 2021-07-13 NOTE — Progress Notes (Signed)
Subjective:     Patient ID: Anna Acevedo, female   DOB: June 18, 1935, 86 y.o.   MRN: [id number]  This visit type was conducted due to national recommendations for restrictions regarding the COVID-19 Pandemic (e.g. social distancing) in an effort to limit this patient's exposure and mitigate transmission in our community.  Due to their co-morbid illnesses, this patient is at least at moderate risk for complications without adequate follow up.  This format is felt to be most appropriate for this patient at this time.    Documentation for virtual audio and video telecommunications through [place] encounter:  The patient was located at home. The provider was located in the office. The patient did consent to this visit and is aware of possible charges through their insurance for this visit.  The other persons participating in this telemedicine service were none. Time spent on call was 20 minutes and in review of previous records 20 minutes total.  This virtual service is not related to other E/M service within previous 7 days.   HPI Chief Complaint  Patient presents with   Cough    Has had for over a week with nasal congestion and drainage. No fever/ body aches / chills.    Virtual consult for illness.  She notes 1+ week with aggravating cough, nasal congestion, drainage of mucous, sinus pressure.  No nvd, no sob , some wheeeze.  Started with chest congestion and sore throat.   No other symptoms.  Not improving despite Coricidin.   Nonsmoker.  No hx/o lung disease.  +some sick contacts.  No specific covid or flu exposure.  No other aggravating or relieving factors. No other complaint.   Past Medical History:  Diagnosis Date   Colon polyp    Diverticulosis    Dyspnea    GERD (gastroesophageal reflux disease)    Hypercholesterolemia    Hypertension    Essential   Obesity    Osteoporosis    OSTEOPENIA   Palpitations    Vitamin D deficiency    Current Outpatient Medications  on File Prior to Visit  Medication Sig Dispense Refill   aspirin 81 MG tablet Take 81 mg by mouth every morning.     Calcium Carb-Cholecalciferol (CALCIUM 600-D PO) Take 1 tablet by mouth every morning.     Cholecalciferol (VITAMIN D3 PO) Take 1 tablet by mouth every morning.     losartan-hydrochlorothiazide (HYZAAR) 50-12.5 MG tablet Take 0.5 tablets by mouth every morning. 45 tablet 3   metoprolol tartrate (LOPRESSOR) 50 MG tablet TAKE 1 TABLET BY MOUTH TWICE A DAY (Patient taking differently: Take 50 mg by mouth 2 (two) times daily.) 180 tablet 3   Multiple Vitamins-Minerals (ZINC PO) Take 1 tablet by mouth every morning.     pravastatin (PRAVACHOL) 40 MG tablet Take 1 tablet (40 mg total) by mouth every morning. 90 tablet 3   zinc gluconate 50 MG tablet Take 50 mg by mouth daily.     acetaminophen (TYLENOL) 500 MG tablet Take 500 mg by mouth 2 (two) times daily.     vitamin B-12 (CYANOCOBALAMIN) 1000 MCG tablet Take 1,000 mcg by mouth daily.     No current facility-administered medications on file prior to visit.    Review of Systems As in subjective    Objective:   Physical Exam Due to coronavirus pandemic stay at home measures, patient visit was virtual and they were not examined in person.   Wt 177 lb (80.3 kg)    BMI 32.37 kg/m  Gen: nad, white female No labored breathing or wheezing       Assessment:     Encounter Diagnoses  Name Primary?   Sinus pressure Yes   Respiratory tract infection        Plan:     Discussed symptoms, concerns, supportive measures.  Begin medicaiton below.   If not much improved within in 72 hours, call or recheck.   Anna Acevedo was seen today for cough.  Diagnoses and all orders for this visit:  Sinus pressure  Respiratory tract infection  Other orders -     azithromycin (ZITHROMAX) 250 MG tablet; 2 tablets day 1, then 1 tablet days 2-4 -     benzonatate (TESSALON) 200 MG capsule; Take 1 capsule (200 mg total) by mouth 3 (three)  times daily as needed for cough.  F/u prn

## 2021-08-20 ENCOUNTER — Other Ambulatory Visit: Payer: Self-pay | Admitting: Medical

## 2021-08-20 NOTE — Telephone Encounter (Signed)
Pt called to advise that she is still having a bad cough. Pt was taking benzonatate which was prescribed by Audelia Acton in December. She is requesting to have this sent in again. Please advise [facility]

## 2021-09-22 DIAGNOSIS — H524 Presbyopia: Secondary | ICD-10-CM | POA: Diagnosis not present

## 2021-09-22 DIAGNOSIS — H04123 Dry eye syndrome of bilateral lacrimal glands: Secondary | ICD-10-CM | POA: Diagnosis not present

## 2021-09-22 DIAGNOSIS — H35033 Hypertensive retinopathy, bilateral: Secondary | ICD-10-CM | POA: Diagnosis not present

## 2021-09-22 DIAGNOSIS — D23111 Other benign neoplasm of skin of right upper eyelid, including canthus: Secondary | ICD-10-CM | POA: Diagnosis not present

## 2021-10-23 ENCOUNTER — Other Ambulatory Visit: Payer: Self-pay | Admitting: Family Medicine

## 2021-10-23 DIAGNOSIS — I119 Hypertensive heart disease without heart failure: Secondary | ICD-10-CM

## 2021-10-25 IMAGING — CT CT CERVICAL SPINE W/O CM
3 of 9 series · 12 of 33 positions shown, 13 images · IV contrast (APPLIED)
Comparison: CT angiogram neck September 12, 2019; cervical spine CT
September 06, 2010

CLINICAL DATA: Cervicalgia

EXAM:
CT CERVICAL SPINE WITHOUT CONTRAST
TECHNIQUE: Multidetector CT imaging of the cervical spine was performed without
intravenous contrast. Multiplanar CT image reconstructions were also
generated.

[Series 7: ax thins · axial · 0.39mm/px · z∈[-236,-7]mm · 4 of 345 slices shown, 5 images]
[im 58/345  soft-tissue]
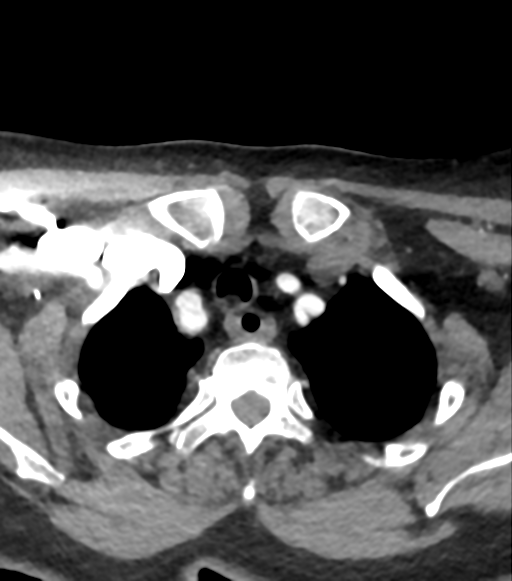
[im 58/345  bone]
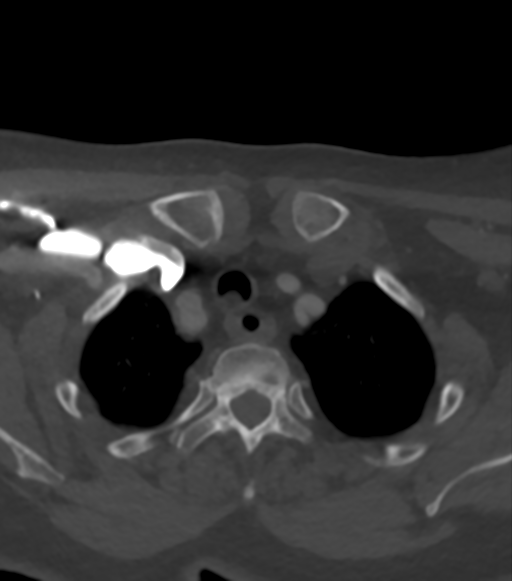
[im 115/345  bone]
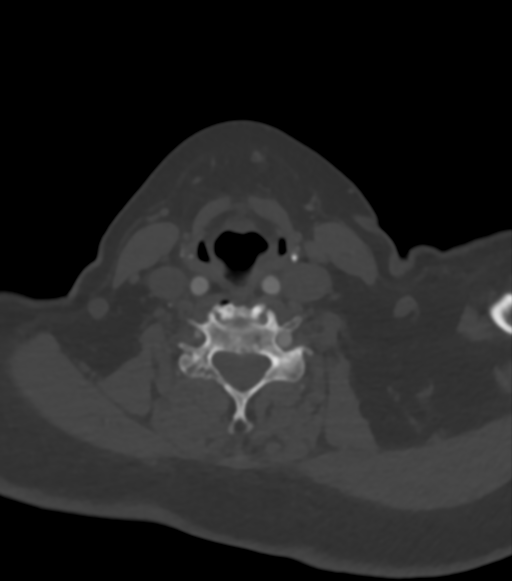
[im 230/345  bone]
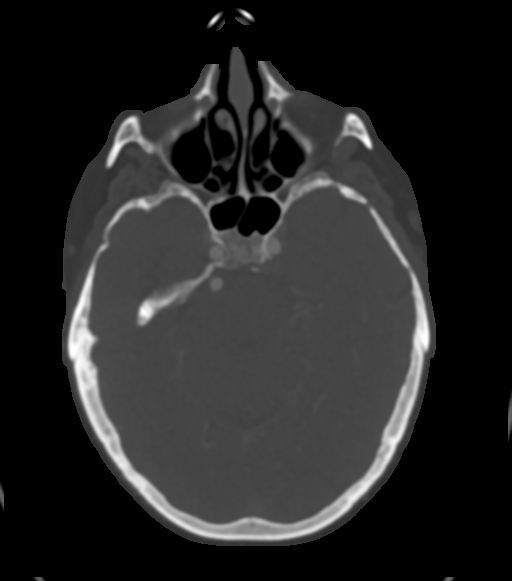
[im 287/345  bone]
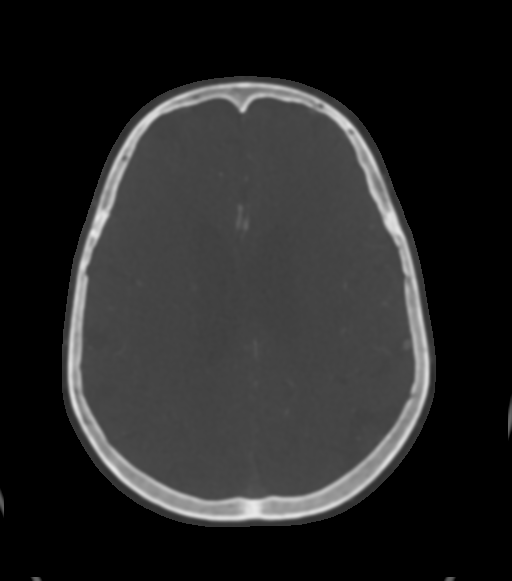

[Series 8: cor thins · coronal · 0.41mm/px · 3 of 201 slices shown]
[im 51/201  bone]
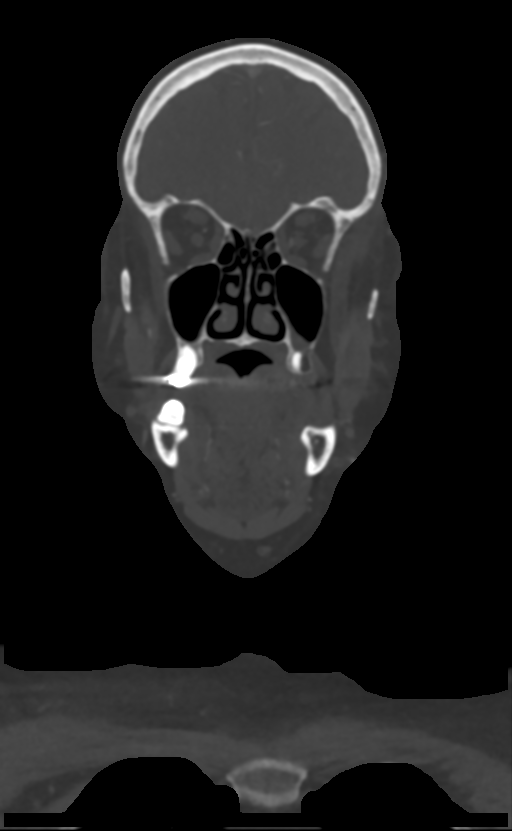
[im 101/201  bone]
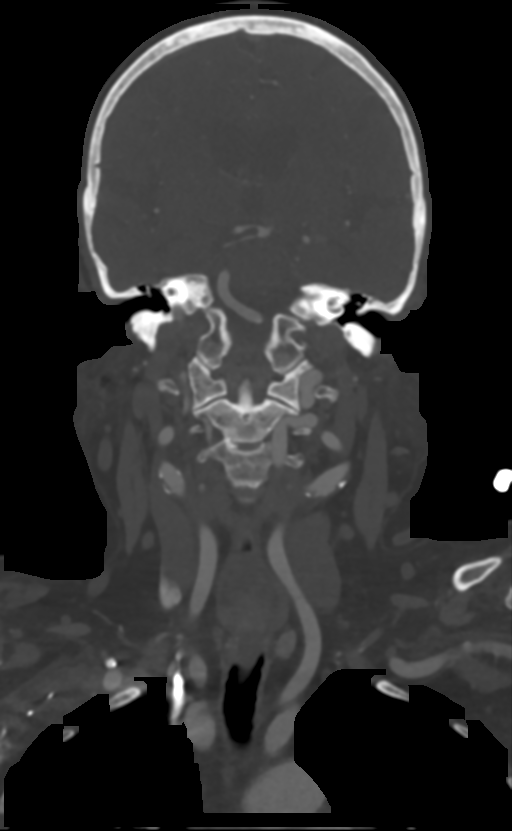
[im 151/201  bone]
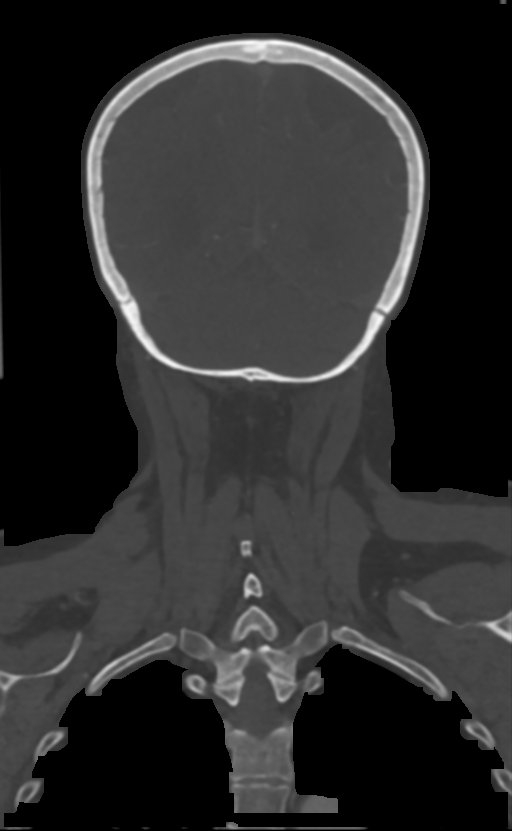

[Series 9: sag thins · sagittal · 0.45mm/px · 5 of 201 slices shown]
[im 34/201  bone]
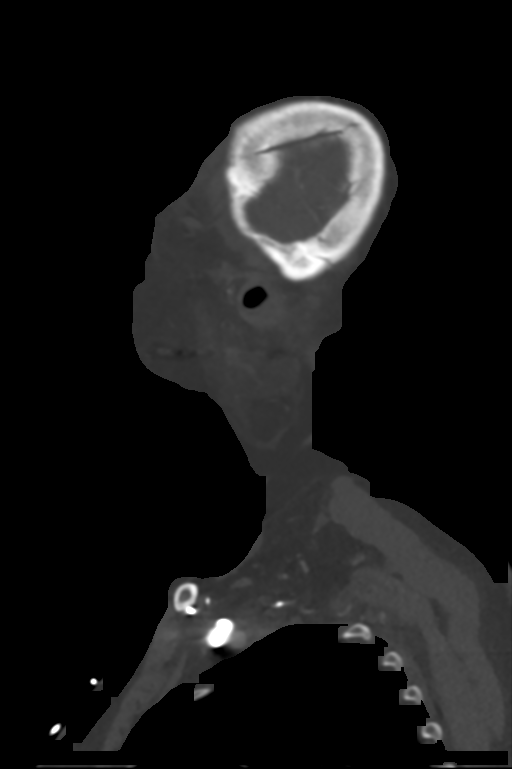
[im 67/201  bone]
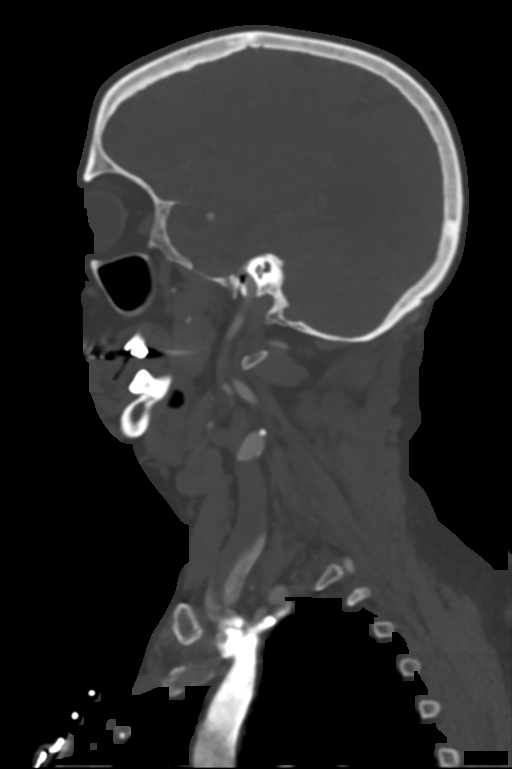
[im 101/201  bone]
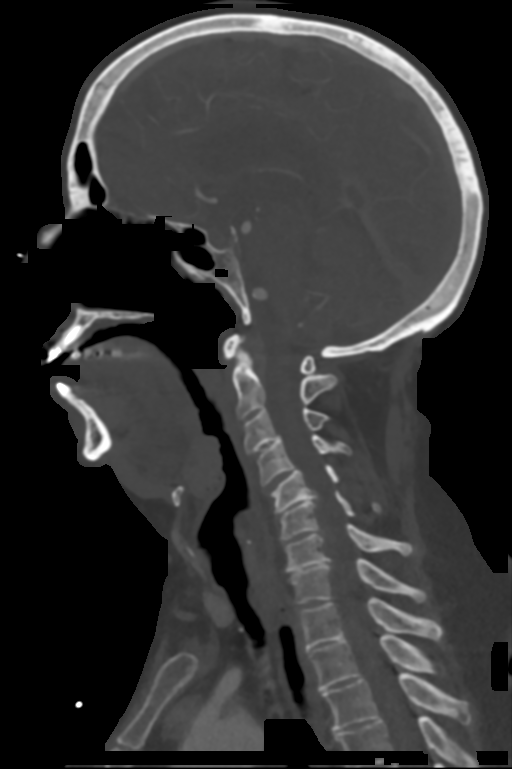
[im 134/201  bone]
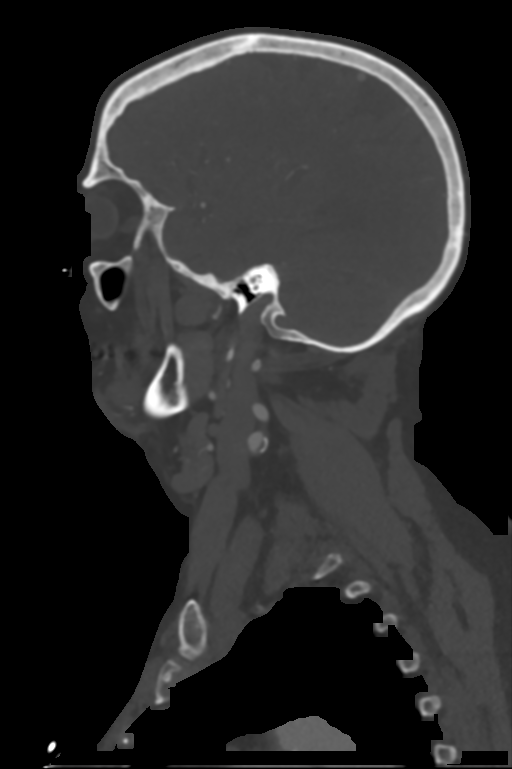
[im 167/201  bone]
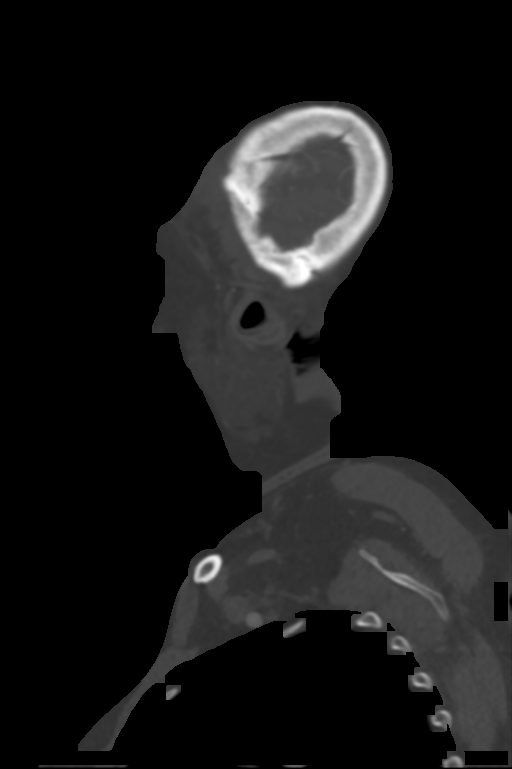

[12 of 33 positions shown; findings below may reference images not displayed]

FINDINGS: Alignment: There is stable 1 mm of anterolisthesis of C4 on C5. No
other spondylolisthesis evident.

Skull base and vertebrae: Skull base and craniocervical junction
regions appear normal. There is no appreciable fracture. No blastic
or lytic bone lesions are appreciable.

Soft tissues and spinal canal: Prevertebral soft tissues and
predental space regions are normal. No evident cord or canal
hematoma. No paraspinous lesions are evident.

Disc levels: There is severe disc space narrowing at C7-T1 with
moderately severe disc space narrowing at C5-6 and C6-7. There is
milder disc space narrowing at C2-3 and C3-4.

At C2-3, there is moderate facet hypertrophy bilaterally. No nerve
root edema or effacement. No disc extrusion or stenosis.

At C3-4, there is facet hypertrophy bilaterally, more severe on the
left than on the right. There is slight impression on the exiting
nerve root at C3-4 on the left due to bony hypertrophy. No disc
extrusion or stenosis.

At C4-5, there is facet hypertrophy which is marked on the left and
moderate on the right. No nerve root edema or effacement. Mild
impression on the exiting nerve root on the left at C4-5 is noted.

At C5-6, there is moderate facet hypertrophy bilaterally. Facet
hypertrophy bilaterally with stent symmetric impression on exiting
nerve roots bilaterally. There is left paracentral disc protrusion
without spinal stenosis. There is no frank disc extrusion.

At C6-7, there is moderate facet hypertrophy bilaterally. There is
impression on exiting nerve roots due to bony hypertrophy
bilaterally. No frank disc extrusion or stenosis.

At C7-T1, there is moderate facet hypertrophy bilaterally. There is
mild impression on exiting nerve roots bilaterally due to bony
hypertrophy. No frank disc extrusion or stenosis.

Upper chest: There is mild upper lobe atelectatic change. No edema
or airspace opacity.

Other: None
IMPRESSION: 1. Multilevel osteoarthritic change. Exit foraminal narrowing with
impression on several exiting nerve roots due to bony hypertrophy as
noted. No frank disc extrusion or stenosis.
2. No evident fracture.
3. 1 mm of anterolisthesis of C4 on C5 is stable. No new
spondylolisthesis.

## 2021-11-19 ENCOUNTER — Other Ambulatory Visit: Payer: Self-pay | Admitting: Family Medicine

## 2021-11-19 DIAGNOSIS — E785 Hyperlipidemia, unspecified: Secondary | ICD-10-CM

## 2021-11-20 ENCOUNTER — Other Ambulatory Visit: Payer: Self-pay | Admitting: Family Medicine

## 2021-11-20 DIAGNOSIS — I10 Essential (primary) hypertension: Secondary | ICD-10-CM

## 2021-11-23 ENCOUNTER — Ambulatory Visit (INDEPENDENT_AMBULATORY_CARE_PROVIDER_SITE_OTHER): Payer: Medicare Other | Admitting: Family Medicine

## 2021-11-23 ENCOUNTER — Encounter: Payer: Self-pay | Admitting: Family Medicine

## 2021-11-23 VITALS — BP 140/82 | HR 51 | Temp 96.8°F | Ht 62.0 in | Wt 177.6 lb

## 2021-11-23 DIAGNOSIS — E785 Hyperlipidemia, unspecified: Secondary | ICD-10-CM

## 2021-11-23 DIAGNOSIS — Z0000 Encounter for general adult medical examination without abnormal findings: Secondary | ICD-10-CM

## 2021-11-23 DIAGNOSIS — E669 Obesity, unspecified: Secondary | ICD-10-CM

## 2021-11-23 DIAGNOSIS — M150 Primary generalized (osteo)arthritis: Secondary | ICD-10-CM

## 2021-11-23 DIAGNOSIS — I119 Hypertensive heart disease without heart failure: Secondary | ICD-10-CM

## 2021-11-23 DIAGNOSIS — I700 Atherosclerosis of aorta: Secondary | ICD-10-CM

## 2021-11-23 DIAGNOSIS — E782 Mixed hyperlipidemia: Secondary | ICD-10-CM

## 2021-11-23 DIAGNOSIS — E66811 Obesity, class 1: Secondary | ICD-10-CM

## 2021-11-23 DIAGNOSIS — M159 Polyosteoarthritis, unspecified: Secondary | ICD-10-CM

## 2021-11-23 DIAGNOSIS — I10 Essential (primary) hypertension: Secondary | ICD-10-CM

## 2021-11-23 DIAGNOSIS — R7301 Impaired fasting glucose: Secondary | ICD-10-CM | POA: Diagnosis not present

## 2021-11-23 DIAGNOSIS — M8580 Other specified disorders of bone density and structure, unspecified site: Secondary | ICD-10-CM | POA: Diagnosis not present

## 2021-11-23 DIAGNOSIS — E538 Deficiency of other specified B group vitamins: Secondary | ICD-10-CM

## 2021-11-23 MED ORDER — PRAVASTATIN SODIUM 40 MG PO TABS: ORAL_TABLET | ORAL | 3 refills | Status: DC

## 2021-11-23 MED ORDER — LOSARTAN POTASSIUM-HCTZ 50-12.5 MG PO TABS: 0.5000 | ORAL_TABLET | Freq: Every morning | ORAL | 3 refills | Status: DC

## 2021-11-23 MED ORDER — METOPROLOL TARTRATE 50 MG PO TABS: 50.0000 mg | ORAL_TABLET | Freq: Two times a day (BID) | ORAL | 3 refills | Status: DC

## 2021-11-23 NOTE — Progress Notes (Signed)
Anna Acevedo is a 87 y.o. female who presents for annual wellness visit ,CPE and follow-up on chronic medical conditions.  She has no particular concerns or complaints.  She does have a history of hypertension and continues on metoprolol as well as losartan/HCTZ.  She has had no chest pain, shortness of breath, PND or DOE.  She does complain of some minor arthritic symptoms but is not taking anything regularly.  She continues on Pravachol.  Does have a history of aortic atherosclerosis.  She does have a history of COPD anemia as well as vitamin B12 deficiency.  She remains active but is not involved in any exercise program.  She has been married for 64 years. ? ?Immunizations and Health Maintenance ?Immunization History  ?Administered Date(s) Administered  ? DTaP 04/27/2004  ? Influenza Whole 06/03/2011  ? PFIZER(Purple Top)SARS-COV-2 Vaccination 09/19/2019, 10/16/2019, 06/01/2020  ? Pension scheme manager 71yr & up 05/18/2021  ? Pneumococcal Conjugate-13 05/23/2007  ? Pneumococcal Polysaccharide-23 02/11/2013  ? Tdap 07/19/2007, 10/03/2017  ? Zoster Recombinat (Shingrix) 11/16/2021  ? Zoster, Live 07/15/2008  ? ?There are no preventive care reminders to display for this patient. ? ?Last Pap smear: aged out  ?Last mammogram: 05/27/13 ?Last colonoscopy: 07/06/2008 aged out ?Last DEXA: 12/03/19 aged out  ?Dentist: Q six months  ?Ophtho: Q year ?Exercise: N/A ? ?Other doctors caring for patient include: Dr. HDebara Pickettcardiology ?           Dr. JRonnald Rampdermatology ?                ? ?Advanced directives: ?Does Patient Have a Medical Advance Directive?: Yes ?Type of Advance Directive: Living will, Healthcare Power of Attorney ?Does patient want to make changes to medical advance directive?: No - Patient declined ?Copy of [place] Chart?: Yes - validated most recent copy scanned in chart (See row information) ? ?Depression screen:  See questionnaire below.  ? ?  11/23/2021  ?  9:32  AM 11/10/2020  ?  9:28 AM 10/14/2019  ?  9:51 AM 10/10/2018  ?  9:50 AM 10/03/2016  ?  9:05 AM  ?Depression screen PHQ 2/9  ?Decreased Interest 0 0 0 0 0  ?Down, Depressed, Hopeless 0 0 0 0 0  ?PHQ - 2 Score 0 0 0 0 0  ? ? ?Fall Risk Screen: see questionnaire below. ? ?  11/23/2021  ?  9:32 AM 11/10/2020  ?  9:27 AM 10/14/2019  ?  9:51 AM 10/10/2018  ?  9:50 AM 10/03/2016  ?  9:05 AM  ?Fall Risk   ?Falls in the past year? 0 0 0 0 No  ?Number falls in past yr: 0 0     ?Injury with Fall? 0 0     ?Risk for fall due to : No Fall Risks No Fall Risks     ?Follow up Falls evaluation completed Falls evaluation completed     ? ? ?ADL screen:  See questionnaire below ?Functional Status Survey: ?Is the patient deaf or have difficulty hearing?: No ?Does the patient have difficulty seeing, even when wearing glasses/contacts?: No ?Does the patient have difficulty concentrating, remembering, or making decisions?: No ?Does the patient have difficulty walking or climbing stairs?: No ?Does the patient have difficulty dressing or bathing?: No ?Does the patient have difficulty doing errands alone such as visiting a doctor's office or shopping?: No ? ? ?Review of Systems ?Constitutional: -, -unexpected weight change, -anorexia, -fatigue ?Allergy: -sneezing, -itching, -congestion ?Dermatology:  denies changing moles, rash, lumps ?ENT: -runny nose, -ear pain, -sore throat,  ?Cardiology:  -chest pain, -palpitations, -orthopnea, ?Respiratory: -cough, -shortness of breath, -dyspnea on exertion, -wheezing,  ?Gastroenterology: -abdominal pain, -nausea, -vomiting, -diarrhea, -constipation, -dysphagia ?Hematology: -bleeding or bruising problems ?Musculoskeletal: -arthralgias, -myalgias, -joint swelling, -back pain, - ?Ophthalmology: -vision changes,  ?Urology: -dysuria, -difficulty urinating,  -urinary frequency, -urgency, incontinence ?Neurology: -, -numbness, , -memory loss, -falls, -dizziness ? ?PHYSICAL EXAM: ? ?General Appearance: Alert, cooperative,  no distress, appears stated age ?Head: Normocephalic, without obvious abnormality, atraumatic ?Eyes: PERRL, conjunctiva/corneas clear, EOM's intact,  ?Ears: Normal TM's and external ear canals ?Nose: Nares normal, mucosa normal, no drainage or sinus tenderness ?Throat: Lips, mucosa, and tongue normal; teeth and gums normal ?Neck: Supple, no lymphadenopathy;  thyroid:  no enlargement/tenderness/nodules; no carotid bruit or JVD ?Lungs: Clear to auscultation bilaterally without wheezes, rales or ronchi; respirations unlabored ?Heart: Regular rate and rhythm, S1 and S2 normal, no murmur, rubor gallop ?Abdomen: Soft, non-tender, nondistended, normoactive bowel sounds,  ?no masses, no hepatosplenomegaly ?Extremities: No clubbing, cyanosis or edema ?Skin:  Skin color, texture, turgor normal, no rashes or lesions ?Lymph nodes: Cervical, supraclavicular, and axillary nodes normal ?Neurologic:  CNII-XII intact, normal strength, sensation and gait; Psych: Normal mood, affect, hygiene and grooming. ? ?ASSESSMENT/PLAN: ?Routine general medical examination at a health care facility - Plan: CBC with Differential/Platelet, Comprehensive metabolic panel, Lipid panel ? ?Mixed hyperlipidemia - Plan: Lipid panel ? ?Primary osteoarthritis involving multiple joints - Plan: CBC with Differential/Platelet, Comprehensive metabolic panel ? ?Osteopenia, unspecified location - Plan: DG Bone Density ? ?Obesity (BMI 30.0-34.9) - Plan: CBC with Differential/Platelet, Comprehensive metabolic panel, Lipid panel ? ?Benign essential hypertension - Plan: losartan-hydrochlorothiazide (HYZAAR) 50-12.5 MG tablet ? ?Aortic atherosclerosis ([place]) ? ?Benign hypertensive heart disease without heart failure - Plan: metoprolol tartrate (LOPRESSOR) 50 MG tablet ? ?Hyperlipidemia, unspecified hyperlipidemia type - Plan: pravastatin (PRAVACHOL) 40 MG tablet ? ?B12 deficiency - Plan: Vitamin B12 ? ?Discussed at least 30 minutes of aerobic activity at least 5  days/week and weight-bearing exercise 2x/week; proper sunscreen use reviewed; healthy diet, including goals of calcium and vitamin D intake a; regular seatbelt use; changing batteries in smoke detectors.  Immunization recommendations discussed.  ? ?Medicare Attestation ?I have personally reviewed: ?The patient's medical and social history ?Their use of alcohol, tobacco or illicit drugs ?Their current medications and supplements ?The patient's functional ability including ADLs,fall risks, home safety risks, cognitive, and hearing and visual impairment ?Diet and physical activities ?Evidence for depression or mood disorders ? ?The patient's weight, height, and BMI have been recorded in the chart.  I have made referrals, counseling, and provided education to the patient based on review of the above and I have provided the patient with a written personalized care plan for preventive services.   ? ? ?Jill Alexanders, MD   11/23/2021  ?

## 2021-11-23 NOTE — Patient Instructions (Signed)
?  Anna Acevedo , ?Thank you for taking time to come for your Medicare Wellness Visit. I appreciate your ongoing commitment to your health goals. Please review the following plan we discussed and let me know if I can assist you in the future.  ? ?These are the goals we discussed: ? Goals   ?None ?  ?  ?This is a list of the screening recommended for you and due dates:  ?Health Maintenance  ?Topic Date Due  ? Zoster (Shingles) Vaccine (2 of 2) 01/11/2022  ? Flu Shot  02/15/2022  ? Tetanus Vaccine  10/04/2027  ? Pneumonia Vaccine  Completed  ? DEXA scan (bone density measurement)  Completed  ? COVID-19 Vaccine  Completed  ? HPV Vaccine  Aged Out  ?  ?

## 2021-11-24 LAB — COMPREHENSIVE METABOLIC PANEL
ALT: 15 IU/L (ref 0–32)
AST: 21 IU/L (ref 0–40)
Albumin/Globulin Ratio: 1.7 (ref 1.2–2.2)
Albumin: 4.4 g/dL (ref 3.6–4.6)
Alkaline Phosphatase: 73 IU/L (ref 44–121)
BUN/Creatinine Ratio: 16 (ref 12–28)
BUN: 16 mg/dL (ref 8–27)
Bilirubin Total: 0.3 mg/dL (ref 0.0–1.2)
CO2: 26 mmol/L (ref 20–29)
Calcium: 10.1 mg/dL (ref 8.7–10.3)
Chloride: 103 mmol/L (ref 96–106)
Creatinine, Ser: 1 mg/dL (ref 0.57–1.00)
Globulin, Total: 2.6 g/dL (ref 1.5–4.5)
Glucose: 107 mg/dL — ABNORMAL HIGH (ref 70–99)
Potassium: 4.7 mmol/L (ref 3.5–5.2)
Sodium: 142 mmol/L (ref 134–144)
Total Protein: 7 g/dL (ref 6.0–8.5)
eGFR: 56 mL/min/{1.73_m2} — ABNORMAL LOW (ref >59)

## 2021-11-24 LAB — CBC WITH DIFFERENTIAL/PLATELET
Basophils Absolute: 0.1 10*3/uL (ref 0.0–0.2)
Basos: 1 %
EOS (ABSOLUTE): 0.5 10*3/uL — ABNORMAL HIGH (ref 0.0–0.4)
Eos: 5 %
Hematocrit: 42.6 % (ref 34.0–46.6)
Hemoglobin: 14 g/dL (ref 11.1–15.9)
Immature Grans (Abs): 0.1 10*3/uL (ref 0.0–0.1)
Immature Granulocytes: 1 %
Lymphocytes Absolute: 1.6 10*3/uL (ref 0.7–3.1)
Lymphs: 17 %
MCH: 27.2 pg (ref 26.6–33.0)
MCHC: 32.9 g/dL (ref 31.5–35.7)
MCV: 83 fL (ref 79–97)
Monocytes Absolute: 0.7 10*3/uL (ref 0.1–0.9)
Monocytes: 8 %
Neutrophils Absolute: 6.5 10*3/uL (ref 1.4–7.0)
Neutrophils: 68 %
Platelets: 213 10*3/uL (ref 150–450)
RBC: 5.14 x10E6/uL (ref 3.77–5.28)
RDW: 15 % (ref 11.7–15.4)
WBC: 9.4 10*3/uL (ref 3.4–10.8)

## 2021-11-24 LAB — LIPID PANEL
Chol/HDL Ratio: 4.9 ratio — ABNORMAL HIGH (ref 0.0–4.4)
Cholesterol, Total: 183 mg/dL (ref 100–199)
HDL: 37 mg/dL — ABNORMAL LOW (ref >39)
LDL Chol Calc (NIH): 111 mg/dL — ABNORMAL HIGH (ref 0–99)
Triglycerides: 198 mg/dL — ABNORMAL HIGH (ref 0–149)
VLDL Cholesterol Cal: 35 mg/dL (ref 5–40)

## 2021-11-24 LAB — VITAMIN B12: Vitamin B-12: 1681 pg/mL — ABNORMAL HIGH (ref 232–1245)

## 2021-11-25 LAB — HGB A1C W/O EAG: Hgb A1c MFr Bld: 5.9 % — ABNORMAL HIGH (ref 4.8–5.6)

## 2021-11-25 LAB — SPECIMEN STATUS REPORT

## 2021-12-30 ENCOUNTER — Encounter: Payer: Self-pay | Admitting: Family Medicine

## 2022-01-26 DIAGNOSIS — H1131 Conjunctival hemorrhage, right eye: Secondary | ICD-10-CM | POA: Diagnosis not present

## 2022-03-23 ENCOUNTER — Encounter: Payer: Self-pay | Admitting: Internal Medicine

## 2022-03-30 ENCOUNTER — Ambulatory Visit
Admission: RE | Admit: 2022-03-30 | Discharge: 2022-03-30 | Disposition: A | Discharge status: Home / Self Care | Payer: Medicare Other | Source: Ambulatory Visit | Attending: Family Medicine | Admitting: Family Medicine

## 2022-03-30 DIAGNOSIS — M8589 Other specified disorders of bone density and structure, multiple sites: Secondary | ICD-10-CM | POA: Diagnosis not present

## 2022-03-30 DIAGNOSIS — Z780 Asymptomatic menopausal state: Secondary | ICD-10-CM | POA: Diagnosis not present

## 2022-04-21 DIAGNOSIS — B078 Other viral warts: Secondary | ICD-10-CM | POA: Diagnosis not present

## 2022-04-21 HISTORY — PX: SKIN BIOPSY: SHX1

## 2022-04-26 ENCOUNTER — Encounter: Payer: Self-pay | Admitting: Internal Medicine

## 2022-05-09 ENCOUNTER — Encounter: Payer: Self-pay | Admitting: Internal Medicine

## 2022-05-24 ENCOUNTER — Encounter: Payer: Self-pay | Admitting: Family Medicine

## 2022-07-20 ENCOUNTER — Encounter: Payer: Self-pay | Admitting: Family Medicine

## 2022-07-20 ENCOUNTER — Telehealth (INDEPENDENT_AMBULATORY_CARE_PROVIDER_SITE_OTHER): Payer: Medicare Other | Admitting: Family Medicine

## 2022-07-20 VITALS — Temp 99.4°F

## 2022-07-20 DIAGNOSIS — J40 Bronchitis, not specified as acute or chronic: Secondary | ICD-10-CM | POA: Diagnosis not present

## 2022-07-20 MED ORDER — AZITHROMYCIN 500 MG PO TABS: 500.0000 mg | ORAL_TABLET | Freq: Every day | ORAL | 0 refills | Status: DC

## 2022-07-20 NOTE — Progress Notes (Signed)
   Subjective:    Patient ID: Anna Acevedo, female    DOB: [birth year], 88 y.o.   MRN: [id number]  HPI Documentation for virtual audio and video telecommunications through [place] encounter: The patient was located at home. 2 patient identifiers used.  The provider was located in the office. The patient did consent to this visit and is aware of possible charges through their insurance for this visit. The other persons participating in this telemedicine service were none. Time spent on call was 5 minutes and in review of previous records >15 minutes total for counseling and coordination of care. This virtual service is not related to other E/M service within previous 7 days.  She complains of a 3-week history of difficulty with a cough that is intermittently productive as well as slight sore throat but no fever, chills, earache, shortness of breath.  She has tried Delsym and Mucinex without success.  She does have Tessalon Perles but have not used it yet.  Review of Systems     Objective:   Physical Exam Alert and in no distress with normal breathing pattern       Assessment & Plan:  Bronchitis - Plan: azithromycin (ZITHROMAX) 500 MG tablet I think she probably has a low-grade bronchitis and it is worthwhile treating it.  Explained that the medicine can last up to a week and if she is still having difficulty after 1 week she is to call for further evaluation and possible treatment.  She was comfortable with

## 2022-10-26 DIAGNOSIS — Z961 Presence of intraocular lens: Secondary | ICD-10-CM | POA: Diagnosis not present

## 2022-10-26 DIAGNOSIS — D23111 Other benign neoplasm of skin of right upper eyelid, including canthus: Secondary | ICD-10-CM | POA: Diagnosis not present

## 2022-10-26 DIAGNOSIS — H35033 Hypertensive retinopathy, bilateral: Secondary | ICD-10-CM | POA: Diagnosis not present

## 2022-11-03 DIAGNOSIS — B079 Viral wart, unspecified: Secondary | ICD-10-CM | POA: Diagnosis not present

## 2022-11-03 DIAGNOSIS — B078 Other viral warts: Secondary | ICD-10-CM | POA: Diagnosis not present

## 2022-11-03 DIAGNOSIS — L820 Inflamed seborrheic keratosis: Secondary | ICD-10-CM | POA: Diagnosis not present

## 2022-11-28 ENCOUNTER — Ambulatory Visit: Payer: Medicare Other | Admitting: Family Medicine

## 2022-11-29 ENCOUNTER — Encounter: Payer: Self-pay | Admitting: Family Medicine

## 2022-11-29 ENCOUNTER — Ambulatory Visit (INDEPENDENT_AMBULATORY_CARE_PROVIDER_SITE_OTHER): Payer: Medicare Other | Admitting: Family Medicine

## 2022-11-29 VITALS — BP 140/70 | HR 56 | Ht 62.0 in | Wt 162.4 lb

## 2022-11-29 DIAGNOSIS — I119 Hypertensive heart disease without heart failure: Secondary | ICD-10-CM | POA: Diagnosis not present

## 2022-11-29 DIAGNOSIS — E538 Deficiency of other specified B group vitamins: Secondary | ICD-10-CM

## 2022-11-29 DIAGNOSIS — E669 Obesity, unspecified: Secondary | ICD-10-CM

## 2022-11-29 DIAGNOSIS — E785 Hyperlipidemia, unspecified: Secondary | ICD-10-CM | POA: Diagnosis not present

## 2022-11-29 DIAGNOSIS — M8580 Other specified disorders of bone density and structure, unspecified site: Secondary | ICD-10-CM

## 2022-11-29 DIAGNOSIS — I10 Essential (primary) hypertension: Secondary | ICD-10-CM | POA: Diagnosis not present

## 2022-11-29 DIAGNOSIS — E782 Mixed hyperlipidemia: Secondary | ICD-10-CM

## 2022-11-29 DIAGNOSIS — M150 Primary generalized (osteo)arthritis: Secondary | ICD-10-CM

## 2022-11-29 DIAGNOSIS — M159 Polyosteoarthritis, unspecified: Secondary | ICD-10-CM

## 2022-11-29 DIAGNOSIS — I700 Atherosclerosis of aorta: Secondary | ICD-10-CM

## 2022-11-29 DIAGNOSIS — E66811 Obesity, class 1: Secondary | ICD-10-CM

## 2022-11-29 MED ORDER — PRAVASTATIN SODIUM 40 MG PO TABS
ORAL_TABLET | ORAL | 3 refills | Status: DC
Start: 2022-11-29 — End: 2024-01-04

## 2022-11-29 MED ORDER — METOPROLOL TARTRATE 50 MG PO TABS: 50.0000 mg | ORAL_TABLET | Freq: Two times a day (BID) | ORAL | 3 refills | Status: DC

## 2022-11-29 MED ORDER — LOSARTAN POTASSIUM-HCTZ 50-12.5 MG PO TABS: 0.5000 | ORAL_TABLET | Freq: Every morning | ORAL | 3 refills | Status: DC

## 2022-11-29 NOTE — Patient Instructions (Addendum)
Call Hospice for counseling [phone]

## 2022-11-29 NOTE — Progress Notes (Signed)
Annual Wellness Visit     Patient: Anna Acevedo, Female    DOB: 03/13/1935, 88 y.o.   MRN: [id number]  Subjective  Chief Complaint  Patient presents with   Annual Exam    Fasting AWV/CPE, no concerns.     Anna Acevedo is a 88 y.o. female who presents today for her Annual Wellness Visit. She reports consuming a general diet. The patient does not participate in regular exercise at present. She generally feels fairly well. She reports sleeping well.  Her husband of 64 years recently died.  She seems to be handling this fairly well.  She continues on losartan as well as metoprolol and is doing well on his medications.  She is having no difficulty with pravastatin.  She does have a history of osteopenia and is using vitamin D and calcium.  She also has a history of vitamin B-12 deficiency and is on supplements for that.  She has no particular arthritic complaints at the present time.  Vision:Within last year      Medications: Outpatient Medications Prior to Visit  Medication Sig Note   aspirin 81 MG tablet Take 81 mg by mouth every morning.    Calcium Carb-Cholecalciferol (CALCIUM 600-D PO) Take 1 tablet by mouth every morning.    Cholecalciferol (VITAMIN D3 PO) Take 1 tablet by mouth every morning.    losartan-hydrochlorothiazide (HYZAAR) 50-12.5 MG tablet Take 0.5 tablets by mouth every morning.    metoprolol tartrate (LOPRESSOR) 50 MG tablet Take 1 tablet (50 mg total) by mouth 2 (two) times daily.    Multiple Vitamins-Minerals (ZINC PO) Take 1 tablet by mouth every morning.    pravastatin (PRAVACHOL) 40 MG tablet TAKE 1 TABLET BY MOUTH EVERY DAY IN THE MORNING    vitamin B-12 (CYANOCOBALAMIN) 1000 MCG tablet Take 1,000 mcg by mouth daily.    acetaminophen (TYLENOL) 500 MG tablet Take 500 mg by mouth 2 (two) times daily. (Patient not taking: Reported on 11/29/2022) 11/29/2022: As needed   [DISCONTINUED] azithromycin (ZITHROMAX) 500 MG tablet Take 1 tablet (500 mg total) by  mouth daily.    [DISCONTINUED] benzonatate (TESSALON) 200 MG capsule TAKE 1 CAPSULE (200 MG TOTAL) BY MOUTH 3 (THREE) TIMES DAILY AS NEEDED FOR COUGH. (Patient not taking: Reported on 11/23/2021)    No facility-administered medications prior to visit.    Allergies  Allergen Reactions   Lisinopril Cough   Penicillins Hives and Swelling   Zocor [Simvastatin] Other (See Comments)    Muscle Pain   Amoxicillin Rash   Cephalexin Rash    Patient Care Team: Ronnald Nian, MD as PCP - General (Family Medicine) Rennis Golden Lisette Abu, MD as PCP - Cardiology (Cardiology)  Review of Systems  All other systems reviewed and are negative.       Objective  BP (!) 140/70   Pulse (!) 56   Ht 5\' 2"  (1.575 m)   Wt 162 lb 6.4 oz (73.7 kg)   SpO2 98%   BMI 29.70 kg/m   Alert and in no distress. Tympanic membranes and canals are normal. Pharyngeal area is normal. Neck is supple without adenopathy or thyromegaly. Cardiac exam shows a regular sinus rhythm without murmurs or gallops. Lungs are clear to auscultation.  Physical Exam  Card: Dr Rennis Golden Dentist: DR. Ralene Cork Optho: Dr. Sherrine Maples @ [facility]: Dr. Donzetta Starch GI: Dr. Madilyn Fireman   Most recent functional status assessment:    11/29/2022    1:28 PM  In your present state of  health, do you have any difficulty performing the following activities:  Hearing? 0  Vision? 0  Difficulty concentrating or making decisions? 1  Comment only aged related memory issues  Walking or climbing stairs? 0  Dressing or bathing? 0  Doing errands, shopping? 0  Preparing Food and eating ? N  Using the Toilet? N  In the past six months, have you accidently leaked urine? N  Do you have problems with loss of bowel control? N  Managing your Medications? N  Managing your Finances? N  Housekeeping or managing your Housekeeping? N   Most recent fall risk assessment:    11/29/2022    1:21 PM  Fall Risk   Falls in the past year? 0  Number falls in past yr: 0   Injury with Fall? 0  Risk for fall due to : No Fall Risks  Follow up Falls evaluation completed    Most recent depression screenings:    11/29/2022    1:21 PM 11/23/2021    9:32 AM  PHQ 2/9 Scores  PHQ - 2 Score 0 0   Most recent cognitive screening:    11/23/2021    9:34 AM  6CIT Screen  What Year? 0 points  What month? 0 points  What time? 0 points  Count back from 20 0 points  Months in reverse 0 points  Repeat phrase 8 points  Total Score 8 points   Most recent Audit-C alcohol use screening     No data to display         A score of 3 or more in women, and 4 or more in men indicates increased risk for alcohol abuse, EXCEPT if all of the points are from question 1   Vision/Hearing Screen: No results found.    No results found for any visits on 11/29/22.    Assessment & Plan  Benign hypertensive heart disease without heart failure - Plan: metoprolol tartrate (LOPRESSOR) 50 MG tablet  Benign essential hypertension - Plan: CBC with Differential/Platelet, Comprehensive metabolic panel, losartan-hydrochlorothiazide (HYZAAR) 50-12.5 MG tablet  Aortic atherosclerosis (HCC)  Osteopenia, unspecified location  Primary osteoarthritis involving multiple joints  Obesity (BMI 30.0-34.9)  Mixed hyperlipidemia  B12 deficiency - Plan: Vitamin B12  Hyperlipidemia, unspecified hyperlipidemia type - Plan: pravastatin (PRAVACHOL) 40 MG tablet, Lipid panel She will continue on her present medication regimen.  Did recommend she call hospice for bereavement counseling.  Also recommend she get COVID and RSV vaccines. Annual wellness visit done today including the all of the following: Reviewed patient's Family Medical History Reviewed and updated list of patient's medical providers Assessment of cognitive impairment was done Assessed patient's functional ability Established a written schedule for health screening services Health Risk Assessent Completed and  Reviewed  Exercise Activities and Dietary recommendations  Goals   None     Immunization History  Administered Date(s) Administered   DTaP 04/27/2004   Influenza Whole 06/03/2011   PFIZER(Purple Top)SARS-COV-2 Vaccination 09/19/2019, 10/16/2019, 06/01/2020   Pfizer Covid-19 Vaccine Bivalent Booster 71yrs & up 05/18/2021   Pneumococcal Conjugate-13 05/23/2007   Pneumococcal Polysaccharide-23 02/11/2013   Tdap 07/19/2007, 10/03/2017   Zoster Recombinat (Shingrix) 11/16/2021, 02/21/2022   Zoster, Live 07/15/2008    Health Maintenance  Topic Date Due   COVID-19 Vaccine (5 - 2023-24 season) 03/18/2022   Medicare Annual Wellness (AWV)  11/24/2022   INFLUENZA VACCINE  02/16/2023   DTaP/Tdap/Td (4 - Td or Tdap) 10/04/2027   Pneumonia Vaccine 29+ Years old  Completed   DEXA  SCAN  Completed   Zoster Vaccines- Shingrix  Completed   HPV VACCINES  Aged Out       Problem List Items Addressed This Visit     Aortic atherosclerosis (HCC)   Benign essential hypertension   Benign hypertensive heart disease without heart failure - Primary   Hyperlipidemia   Obesity (BMI 30.0-34.9)   Osteoarthritis   Osteopenia   Other Visit Diagnoses     B12 deficiency           Follow-up 1 year   Sharlot Gowda, MD

## 2022-11-30 LAB — COMPREHENSIVE METABOLIC PANEL
ALT: 14 IU/L (ref 0–32)
AST: 18 IU/L (ref 0–40)
Albumin/Globulin Ratio: 1.8 (ref 1.2–2.2)
Albumin: 4.4 g/dL (ref 3.7–4.7)
Alkaline Phosphatase: 77 IU/L (ref 44–121)
BUN/Creatinine Ratio: 15 (ref 12–28)
BUN: 13 mg/dL (ref 8–27)
Bilirubin Total: 0.5 mg/dL (ref 0.0–1.2)
CO2: 22 mmol/L (ref 20–29)
Calcium: 10.2 mg/dL (ref 8.7–10.3)
Chloride: 103 mmol/L (ref 96–106)
Creatinine, Ser: 0.84 mg/dL (ref 0.57–1.00)
Globulin, Total: 2.5 g/dL (ref 1.5–4.5)
Glucose: 98 mg/dL (ref 70–99)
Potassium: 4.3 mmol/L (ref 3.5–5.2)
Sodium: 140 mmol/L (ref 134–144)
Total Protein: 6.9 g/dL (ref 6.0–8.5)
eGFR: 68 mL/min/{1.73_m2} (ref >59)

## 2022-11-30 LAB — CBC WITH DIFFERENTIAL/PLATELET
Basophils Absolute: 0.1 10*3/uL (ref 0.0–0.2)
Basos: 1 %
EOS (ABSOLUTE): 0.3 10*3/uL (ref 0.0–0.4)
Eos: 4 %
Hematocrit: 43.4 % (ref 34.0–46.6)
Hemoglobin: 14.1 g/dL (ref 11.1–15.9)
Immature Grans (Abs): 0.1 10*3/uL (ref 0.0–0.1)
Immature Granulocytes: 1 %
Lymphocytes Absolute: 1.6 10*3/uL (ref 0.7–3.1)
Lymphs: 20 %
MCH: 26.6 pg (ref 26.6–33.0)
MCHC: 32.5 g/dL (ref 31.5–35.7)
MCV: 82 fL (ref 79–97)
Monocytes Absolute: 0.6 10*3/uL (ref 0.1–0.9)
Monocytes: 7 %
Neutrophils Absolute: 5.7 10*3/uL (ref 1.4–7.0)
Neutrophils: 67 %
Platelets: 241 10*3/uL (ref 150–450)
RBC: 5.3 x10E6/uL — ABNORMAL HIGH (ref 3.77–5.28)
RDW: 14.7 % (ref 11.7–15.4)
WBC: 8.3 10*3/uL (ref 3.4–10.8)

## 2022-11-30 LAB — LIPID PANEL
Chol/HDL Ratio: 4.2 ratio (ref 0.0–4.4)
Cholesterol, Total: 169 mg/dL (ref 100–199)
HDL: 40 mg/dL (ref >39)
LDL Chol Calc (NIH): 97 mg/dL (ref 0–99)
Triglycerides: 188 mg/dL — ABNORMAL HIGH (ref 0–149)
VLDL Cholesterol Cal: 32 mg/dL (ref 5–40)

## 2022-11-30 LAB — VITAMIN B12: Vitamin B-12: 1597 pg/mL — ABNORMAL HIGH (ref 232–1245)

## 2022-12-19 DIAGNOSIS — L820 Inflamed seborrheic keratosis: Secondary | ICD-10-CM | POA: Diagnosis not present

## 2023-04-19 ENCOUNTER — Telehealth: Payer: Medicare Other | Admitting: Family Medicine

## 2023-04-19 DIAGNOSIS — J069 Acute upper respiratory infection, unspecified: Secondary | ICD-10-CM

## 2023-04-19 NOTE — Progress Notes (Signed)
   Subjective:    Patient ID: Anna Acevedo, female    DOB: 31-Oct-1934, 88 y.o.   MRN: [id number]  HPI Documentation for virtual audio and video telecommunications through [facility] encounter: The patient was located at home. 2 patient identifiers used.  The provider was located in the office. The patient did consent to this visit and is aware of possible charges through their insurance for this visit. The other persons participating in this telemedicine service were none. Time spent on call was 5 minutes and in review of previous records >15 minutes total for counseling and coordination of care. This virtual service is not related to other E/M service within previous 7 days.  She states that she was exposed to a virus couple days ago and yesterday developed hoarse voice and slight chest congestion and is hoping to get an antibiotic.  No fever, chills, coughing, earache or sore throat.  Review of Systems     Objective:    Physical Exam Alert and in no distress otherwise not examined       Assessment & Plan:  Viral URI I explained that she has been exposed to a virus and an antibacterial really not work.  Recommend supportive care with Tylenol, Robitussin DM etc.  Instructed her to call if her symptoms get worse and possibly be tested for COVID.  She was comfortable with that.

## 2023-05-05 ENCOUNTER — Ambulatory Visit (INDEPENDENT_AMBULATORY_CARE_PROVIDER_SITE_OTHER): Payer: Medicare Other | Admitting: Medical

## 2023-05-05 VITALS — BP 124/72 | HR 61 | Temp 97.7°F | Resp 16 | Wt 150.8 lb

## 2023-05-05 DIAGNOSIS — J988 Other specified respiratory disorders: Secondary | ICD-10-CM

## 2023-05-05 DIAGNOSIS — R052 Subacute cough: Secondary | ICD-10-CM | POA: Diagnosis not present

## 2023-05-05 MED ORDER — BENZONATATE 200 MG PO CAPS: 200.0000 mg | ORAL_CAPSULE | Freq: Three times a day (TID) | ORAL | 0 refills | Status: DC | PRN

## 2023-05-05 MED ORDER — AZITHROMYCIN 250 MG PO TABS: ORAL_TABLET | ORAL | 0 refills | Status: DC

## 2023-05-05 NOTE — Progress Notes (Signed)
Subjective:  Anna Acevedo is a 88 y.o. female who presents for Chief Complaint  Patient presents with   Cough    Cough x 3 weeks. Mocous- clear. Otc- robitussin, rattle in chest when laying down per son.      Here for cough for 3 weeks.  Here with her son  Did a virtual  with Dr. Susann Givens initially for viral URI.  Symptoms have lingered.  She does not feel terribly sick but still has rattly cough, some wheeze occasionally.  Not much other symptoms though.  no fever, no sore throat, no nausea, no vomiting.  No headache, no ear pain.  Using robitussin.   Unfortunately her husband passed away of CHF back in Jan 06, 2023.  Her son notes that the noted weight loss today is from not going out to eat frequently like she was with her husband before hand.  She is doing relatively well.  She lives alone but her son and daughter check on her regularly.  No other aggravating or relieving factors.    No other c/o.  Past Medical History:  Diagnosis Date   Colon polyp    Diverticulosis    Dyspnea    GERD (gastroesophageal reflux disease)    Hypercholesterolemia    Hypertension    Essential   Obesity    Osteoporosis    OSTEOPENIA   Palpitations    Vitamin D deficiency    Current Outpatient Medications on File Prior to Visit  Medication Sig Dispense Refill   acetaminophen (TYLENOL) 500 MG tablet Take 500 mg by mouth 2 (two) times daily.     aspirin 81 MG tablet Take 81 mg by mouth every morning.     Calcium Carb-Cholecalciferol (CALCIUM 600-D PO) Take 1 tablet by mouth every morning.     Cholecalciferol (VITAMIN D3 PO) Take 1 tablet by mouth every morning.     losartan-hydrochlorothiazide (HYZAAR) 50-12.5 MG tablet Take 0.5 tablets by mouth every morning. 45 tablet 3   metoprolol tartrate (LOPRESSOR) 50 MG tablet Take 1 tablet (50 mg total) by mouth 2 (two) times daily. 180 tablet 3   Multiple Vitamins-Minerals (ZINC PO) Take 1 tablet by mouth every morning.     pravastatin (PRAVACHOL) 40 MG  tablet TAKE 1 TABLET BY MOUTH EVERY DAY IN THE MORNING 90 tablet 3   vitamin B-12 (CYANOCOBALAMIN) 1000 MCG tablet Take 1,000 mcg by mouth daily.     No current facility-administered medications on file prior to visit.     The following portions of the patient's history were reviewed and updated as appropriate: allergies, current medications, past family history, past medical history, past social history, past surgical history and problem list.  ROS Otherwise as in subjective above    Objective: BP 124/72   Pulse 61   Temp 97.7 F (36.5 C)   Resp 16   Wt 150 lb 12.8 oz (68.4 kg)   SpO2 96%   BMI 27.58 kg/m   Wt Readings from Last 3 Encounters:  05/05/23 150 lb 12.8 oz (68.4 kg)  11/29/22 162 lb 6.4 oz (73.7 kg)  11/23/21 177 lb 9.6 oz (80.6 kg)   General appearance: alert, no distress, well developed, well nourished HEENT: normocephalic, sclerae anicteric, conjunctiva pink and moist, TMs pearly, nares patent, no discharge or erythema, pharynx normal Oral cavity: MMM, no lesions Neck: supple, no lymphadenopathy, no thyromegaly, no masses Heart: RRR, normal S1, S2, no murmurs Lungs: CTA bilaterally, no wheezes, rhonchi, or rales Pulses: 2+ radial pulses, 2+ pedal  pulses, normal cap refill Ext: no edema   Assessment: Encounter Diagnoses  Name Primary?   Subacute cough Yes   Respiratory tract infection      Plan: We discussed her findings and symptoms.  Expressed my sympathy for her loss.  Exam fairly noncontributory today.  Begin medications below.  Continue to rest and hydrate well.  If not much improved by mid next week let me know and we will set her up for chest x-ray.    Anna Acevedo was seen today for cough.  Diagnoses and all orders for this visit:  Subacute cough  Respiratory tract infection  Other orders -     azithromycin (ZITHROMAX) 250 MG tablet; 2 tablets day 1, then 1 tablet days 2-4 -     benzonatate (TESSALON) 200 MG capsule; Take 1  capsule (200 mg total) by mouth 3 (three) times daily as needed for cough.    Follow up: prn

## 2023-05-15 DIAGNOSIS — S80861A Insect bite (nonvenomous), right lower leg, initial encounter: Secondary | ICD-10-CM | POA: Diagnosis not present

## 2023-09-12 ENCOUNTER — Encounter: Payer: Self-pay | Admitting: Internal Medicine

## 2023-10-11 ENCOUNTER — Other Ambulatory Visit: Payer: Self-pay | Admitting: Family Medicine

## 2023-10-11 DIAGNOSIS — I10 Essential (primary) hypertension: Secondary | ICD-10-CM

## 2023-10-21 DIAGNOSIS — R197 Diarrhea, unspecified: Secondary | ICD-10-CM | POA: Diagnosis not present

## 2023-11-10 DIAGNOSIS — Z961 Presence of intraocular lens: Secondary | ICD-10-CM | POA: Diagnosis not present

## 2023-11-10 DIAGNOSIS — D23111 Other benign neoplasm of skin of right upper eyelid, including canthus: Secondary | ICD-10-CM | POA: Diagnosis not present

## 2023-11-10 DIAGNOSIS — H35033 Hypertensive retinopathy, bilateral: Secondary | ICD-10-CM | POA: Diagnosis not present

## 2023-12-05 ENCOUNTER — Ambulatory Visit (INDEPENDENT_AMBULATORY_CARE_PROVIDER_SITE_OTHER)

## 2023-12-05 VITALS — BP 128/70 | HR 55 | Temp 97.7°F | Ht 63.0 in | Wt 150.0 lb

## 2023-12-05 DIAGNOSIS — Z0000 Encounter for general adult medical examination without abnormal findings: Secondary | ICD-10-CM

## 2023-12-05 NOTE — Progress Notes (Signed)
 Subjective:   Anna Acevedo is a 89 y.o. who presents for a Medicare Wellness preventive visit.  As a reminder, Annual Wellness Visits don't include a physical exam, and some assessments may be limited, especially if this visit is performed virtually. We may recommend an in-person follow-up visit with your provider if needed.  Visit Complete: In person    Persons Participating in Visit: Patient.  AWV Questionnaire: No: Patient Medicare AWV questionnaire was not completed prior to this visit.  Cardiac Risk Factors include: advanced age (>63men, >35 women);dyslipidemia;hypertension     Objective:     Today's Vitals   12/05/23 1105  BP: 128/70  Pulse: (!) 55  Temp: 97.7 F (36.5 C)  TempSrc: Oral  SpO2: 95%  Weight: 150 lb (68 kg)  Height: 5\' 3"  (1.6 m)   Body mass index is 26.57 kg/m.     12/05/2023   11:13 AM 11/29/2022    1:27 PM 11/23/2021    9:30 AM 11/10/2020    9:26 AM 09/12/2020    4:00 AM 10/03/2017    7:36 PM 10/03/2016    9:43 AM  Advanced Directives  Does Patient Have a Medical Advance Directive? Yes Yes Yes Yes No Yes Yes  Type of Estate agent of [place];Living will Healthcare Power of [place];Living will;Out of facility DNR (pink MOST or yellow form) Living will;Healthcare Power of [address] Living will  Healthcare Power of [place];Living will   Does patient want to make changes to medical advance directive?  No - Patient declined No - Patient declined No - Patient declined   Yes (MAU/Ambulatory/Procedural Areas - Information given)  Copy of Healthcare Power of Attorney in Chart? Yes - validated most recent copy scanned in chart (See row information) Yes - validated most recent copy scanned in chart (See row information) Yes - validated most recent copy scanned in chart (See row information)      Would patient like information on creating a medical advance directive?     No - Patient declined      Current Medications  (verified) Outpatient Encounter Medications as of 12/05/2023  Medication Sig   acetaminophen  (TYLENOL ) 500 MG tablet Take 500 mg by mouth 2 (two) times daily.   aspirin  81 MG tablet Take 81 mg by mouth every morning.   benzonatate  (TESSALON ) 200 MG capsule Take 1 capsule (200 mg total) by mouth 3 (three) times daily as needed for cough.   Calcium Carb-Cholecalciferol (CALCIUM 600-D PO) Take 1 tablet by mouth every morning.   Cholecalciferol (VITAMIN D3 PO) Take 1 tablet by mouth every morning.   losartan -hydrochlorothiazide  (HYZAAR) 50-12.5 MG tablet TAKE 1/2 TABLET BY MOUTH EVERY MORNING   metoprolol  tartrate (LOPRESSOR ) 50 MG tablet Take 1 tablet (50 mg total) by mouth 2 (two) times daily.   Multiple Vitamins-Minerals (ZINC PO) Take 1 tablet by mouth every morning.   pravastatin  (PRAVACHOL ) 40 MG tablet TAKE 1 TABLET BY MOUTH EVERY DAY IN THE MORNING   vitamin B-12 (CYANOCOBALAMIN ) 1000 MCG tablet Take 1,000 mcg by mouth daily.   azithromycin  (ZITHROMAX ) 250 MG tablet 2 tablets day 1, then 1 tablet days 2-4 (Patient not taking: Reported on 12/05/2023)   No facility-administered encounter medications on file as of 12/05/2023.    Allergies (verified) Lisinopril , Penicillins, Zocor [simvastatin], Amoxicillin, and Cephalexin   History: Past Medical History:  Diagnosis Date   Colon polyp    Diverticulosis    Dyspnea    GERD (gastroesophageal reflux disease)    Hypercholesterolemia  Hypertension    Essential   Obesity    Osteoporosis    OSTEOPENIA   Palpitations    Vitamin D  deficiency    Past Surgical History:  Procedure Laterality Date   CARDIOVASCULAR STRESS TEST  03/30/2006   EF 72%. Showed no evidence of ischemia    COLONOSCOPY     gets every 5 years   EYE SURGERY Bilateral 07/18/2012   Cataracts   SKIN BIOPSY  04/21/2022   left inferior medial posterior arm verruca vulgaris inflamed   US  ECHOCARDIOGRAPHY  03/18/2006   EF 55-60%. Showing  impaired diastolic relaxation  and mild mitral regurgitation   Family History  Problem Relation Age of Onset   Hypertension Mother    Social History   Socioeconomic History   Marital status: Married    Spouse name: Not on file   Number of children: Not on file   Years of education: Not on file   Highest education level: Not on file  Occupational History   Not on file  Tobacco Use   Smoking status: Never   Smokeless tobacco: Never  Vaping Use   Vaping status: Never Used  Substance and Sexual Activity   Alcohol use: No    Alcohol/week: 0.0 standard drinks of alcohol   Drug use: No   Sexual activity: Not Currently  Other Topics Concern   Not on file  Social History Narrative   Not on file   Social Drivers of Health   Financial Resource Strain: Low Risk  (12/05/2023)   Overall Financial Resource Strain (CARDIA)    Difficulty of Paying Living Expenses: Not hard at all  Food Insecurity: No Food Insecurity (12/05/2023)   Hunger Vital Sign    Worried About Running Out of Food in the Last Year: Never true    Ran Out of Food in the Last Year: Never true  Transportation Needs: No Transportation Needs (12/05/2023)   PRAPARE - Administrator, Civil Service (Medical): No    Lack of Transportation (Non-Medical): No  Physical Activity: Inactive (12/05/2023)   Exercise Vital Sign    Days of Exercise per Week: 0 days    Minutes of Exercise per Session: 0 min  Stress: No Stress Concern Present (12/05/2023)   [employer] of Occupational Health - Occupational Stress Questionnaire    Feeling of Stress : Not at all  Social Connections: Moderately Isolated (12/05/2023)   Social Connection and Isolation Panel [NHANES]    Frequency of Communication with Friends and Family: More than three times a week    Frequency of Social Gatherings with Friends and Family: More than three times a week    Attends Religious Services: More than 4 times per year    Active Member of [employer] or Organizations: No    Attends  Banker Meetings: Never    Marital Status: Widowed    Tobacco Counseling Counseling given: Not Answered    Clinical Intake:  Pre-visit preparation completed: Yes  Pain : No/denies pain     Nutritional Risks: None Diabetes: No  Lab Results  Component Value Date   HGBA1C 5.9 (H) 11/23/2021     How often do you need to have someone help you when you read instructions, pamphlets, or other written materials from your doctor or pharmacy?: 1 - Never  Interpreter Needed?: No  Information entered by :: NAllen LPN   Activities of Daily Living     12/05/2023   11:07 AM  In your present state of  health, do you have any difficulty performing the following activities:  Hearing? 0  Vision? 0  Difficulty concentrating or making decisions? 0  Walking or climbing stairs? 1  Dressing or bathing? 0  Doing errands, shopping? 0  Preparing Food and eating ? N  Using the Toilet? N  In the past six months, have you accidently leaked urine? N  Do you have problems with loss of bowel control? N  Managing your Medications? N  Managing your Finances? N  Housekeeping or managing your Housekeeping? N    Patient Care Team: Watson Hacking, MD as PCP - General (Family Medicine) Maximo Spar Aviva Lemmings, MD as PCP - Cardiology (Cardiology)  Indicate any recent Medical Services you may have received from other than [facility] providers in the past year (date may be approximate).     Assessment:    This is a routine wellness examination for [place].  Hearing/Vision screen Hearing Screening - Comments:: Denies hearing issues Vision Screening - Comments:: Regular eye exams, Dr. Allison Ivory   Goals Addressed             This Visit's Progress    Patient Stated       12/05/2023, keep weight down       Depression Screen     12/05/2023   11:15 AM 11/29/2022    1:21 PM 11/23/2021    9:32 AM 11/10/2020    9:28 AM 10/14/2019    9:51 AM 10/10/2018    9:50 AM 10/03/2016    9:05 AM  PHQ 2/9  Scores  PHQ - 2 Score 0 0 0 0 0 0 0  PHQ- 9 Score 0          Fall Risk     12/05/2023   11:15 AM 11/29/2022    1:21 PM 11/23/2021    9:32 AM 11/10/2020    9:27 AM 10/14/2019    9:51 AM  Fall Risk   Falls in the past year? 0 0 0 0 0  Number falls in past yr: 0 0 0 0   Injury with Fall? 0 0 0 0   Risk for fall due to : No Fall Risks No Fall Risks No Fall Risks No Fall Risks   Follow up Falls evaluation completed;Falls prevention discussed Falls evaluation completed Falls evaluation completed Falls evaluation completed     MEDICARE RISK AT HOME:  Medicare Risk at Home Any stairs in or around the home?: Yes If so, are there any without handrails?: No Home free of loose throw rugs in walkways, pet beds, electrical cords, etc?: Yes Adequate lighting in your home to reduce risk of falls?: Yes Life alert?: No Use of a cane, walker or w/c?: No Grab bars in the bathroom?: No Shower chair or bench in shower?: Yes Elevated toilet seat or a handicapped toilet?: No  TIMED UP AND GO:  Was the test performed?  Yes  Length of time to ambulate 10 feet: 5 sec Gait steady and fast without use of assistive device  Cognitive Function: 6CIT completed        12/05/2023   11:16 AM 11/23/2021    9:34 AM  6CIT Screen  What Year? 0 points 0 points  What month? 0 points 0 points  What time? 0 points 0 points  Count back from 20 0 points 0 points  Months in reverse 0 points 0 points  Repeat phrase 6 points 8 points  Total Score 6 points 8 points    Immunizations Immunization History  Administered Date(s) Administered   DTaP 04/27/2004   Influenza Whole 06/03/2011   PFIZER(Purple Top)SARS-COV-2 Vaccination 09/19/2019, 10/16/2019, 06/01/2020   Pfizer Covid-19 Vaccine Bivalent Booster 72yrs & up 05/18/2021   Pneumococcal Conjugate-13 05/23/2007   Pneumococcal Polysaccharide-23 02/11/2013   Tdap 07/19/2007, 10/03/2017   Zoster Recombinant(Shingrix) 11/16/2021, 02/21/2022   Zoster, Live  07/15/2008    Screening Tests Health Maintenance  Topic Date Due   COVID-19 Vaccine (5 - 2024-25 season) 03/19/2023   INFLUENZA VACCINE  02/16/2024   Medicare Annual Wellness (AWV)  12/04/2024   DTaP/Tdap/Td (4 - Td or Tdap) 10/04/2027   Pneumonia Vaccine 17+ Years old  Completed   DEXA SCAN  Completed   Zoster Vaccines- Shingrix  Completed   HPV VACCINES  Aged Out   Meningococcal B Vaccine  Aged Out    Health Maintenance  Health Maintenance Due  Topic Date Due   COVID-19 Vaccine (5 - 2024-25 season) 03/19/2023   Health Maintenance Items Addressed: Declines covid vaccine.  Additional Screening:  Vision Screening: Recommended annual ophthalmology exams for early detection of glaucoma and other disorders of the eye.  Dental Screening: Recommended annual dental exams for proper oral hygiene  Community Resource Referral / Chronic Care Management: CRR required this visit?  No   CCM required this visit?  No   Plan:    I have personally reviewed and noted the following in the patient's chart:   Medical and social history Use of alcohol, tobacco or illicit drugs  Current medications and supplements including opioid prescriptions. Patient is not currently taking opioid prescriptions. Functional ability and status Nutritional status Physical activity Advanced directives List of other physicians Hospitalizations, surgeries, and ER visits in previous 12 months Vitals Screenings to include cognitive, depression, and falls Referrals and appointments  In addition, I have reviewed and discussed with patient certain preventive protocols, quality metrics, and best practice recommendations. A written personalized care plan for preventive services as well as general preventive health recommendations were provided to patient.   Areatha Beecham, LPN   10/24/8117   After Visit Summary: (In Person-Printed) AVS printed and given to the patient  Notes: Nothing significant to  report at this time.

## 2023-12-05 NOTE — Patient Instructions (Signed)
 Anna Acevedo , Thank you for taking time out of your busy schedule to complete your Annual Wellness Visit with me. I enjoyed our conversation and look forward to speaking with you again next year. I, as well as your care team,  appreciate your ongoing commitment to your health goals. Please review the following plan we discussed and let me know if I can assist you in the future. Your Game plan/ To Do List    Referrals: If you haven't heard from the office you've been referred to, please reach out to them at the phone provided.  N/a Follow up Visits: Next Medicare AWV with our clinical staff: 12/17/2024 at 11:30   Have you seen your provider in the last 6 months (3 months if uncontrolled diabetes)? Yes Next Office Visit with your provider: will schedule annual with front desk  Clinician Recommendations:  Aim for 30 minutes of exercise or brisk walking, 6-8 glasses of water, and 5 servings of fruits and vegetables each day.       This is a list of the screening recommended for you and due dates:  Health Maintenance  Topic Date Due   COVID-19 Vaccine (5 - 2024-25 season) 03/19/2023   Flu Shot  02/16/2024   Medicare Annual Wellness Visit  12/04/2024   DTaP/Tdap/Td vaccine (4 - Td or Tdap) 10/04/2027   Pneumonia Vaccine  Completed   DEXA scan (bone density measurement)  Completed   Zoster (Shingles) Vaccine  Completed   HPV Vaccine  Aged Out   Meningitis B Vaccine  Aged Out    Advanced directives: (In Chart) A copy of your advanced directives are scanned into your chart should your provider ever need it. Advance Care Planning is important because it:  [x]  Makes sure you receive the medical care that is consistent with your values, goals, and preferences  [x]  It provides guidance to your family and loved ones and reduces their decisional burden about whether or not they are making the right decisions based on your wishes.  Follow the link provided in your after visit summary or read over the  paperwork we have mailed to you to help you started getting your Advance Directives in place. If you need assistance in completing these, please reach out to us  so that we can help you!  See attachments for Preventive Care and Fall Prevention Tips.

## 2024-01-03 ENCOUNTER — Ambulatory Visit: Admitting: Family Medicine

## 2024-01-04 ENCOUNTER — Other Ambulatory Visit: Payer: Self-pay | Admitting: Family Medicine

## 2024-01-04 DIAGNOSIS — I119 Hypertensive heart disease without heart failure: Secondary | ICD-10-CM

## 2024-01-04 DIAGNOSIS — E785 Hyperlipidemia, unspecified: Secondary | ICD-10-CM

## 2024-01-24 ENCOUNTER — Ambulatory Visit (INDEPENDENT_AMBULATORY_CARE_PROVIDER_SITE_OTHER): Admitting: Family Medicine

## 2024-01-24 ENCOUNTER — Encounter: Payer: Self-pay | Admitting: Family Medicine

## 2024-01-24 VITALS — BP 124/80 | HR 54 | Ht 63.0 in | Wt 149.6 lb

## 2024-01-24 DIAGNOSIS — M8580 Other specified disorders of bone density and structure, unspecified site: Secondary | ICD-10-CM | POA: Diagnosis not present

## 2024-01-24 DIAGNOSIS — I119 Hypertensive heart disease without heart failure: Secondary | ICD-10-CM

## 2024-01-24 DIAGNOSIS — M150 Primary generalized (osteo)arthritis: Secondary | ICD-10-CM

## 2024-01-24 DIAGNOSIS — I10 Essential (primary) hypertension: Secondary | ICD-10-CM

## 2024-01-24 DIAGNOSIS — E782 Mixed hyperlipidemia: Secondary | ICD-10-CM | POA: Diagnosis not present

## 2024-01-24 DIAGNOSIS — Z23 Encounter for immunization: Secondary | ICD-10-CM

## 2024-01-24 DIAGNOSIS — I700 Atherosclerosis of aorta: Secondary | ICD-10-CM

## 2024-01-24 DIAGNOSIS — E66811 Obesity, class 1: Secondary | ICD-10-CM

## 2024-01-24 DIAGNOSIS — Z0000 Encounter for general adult medical examination without abnormal findings: Secondary | ICD-10-CM | POA: Diagnosis not present

## 2024-01-24 LAB — LIPID PANEL

## 2024-01-24 MED ORDER — LOSARTAN POTASSIUM-HCTZ 50-12.5 MG PO TABS: 0.5000 | ORAL_TABLET | Freq: Every morning | ORAL | 0 refills | Status: DC

## 2024-01-24 NOTE — Progress Notes (Signed)
 Complete physical exam  Patient: Anna Acevedo   DOB: April 12, 1935   89 y.o. Female  MRN: [id number]  Subjective:    Chief Complaint  Patient presents with   Annual Exam    Cpe.     Anna Acevedo is a 89 y.o. female who presents today for a complete physical exam.  She reports consuming a general diet. The patient does not participate in regular exercise at present. She generally feels well. She reports sleeping well.  She continues on Pravachol  as well as metoprolol  and losartan .  She is having no difficulty with any of those.  She does take multiple vitamin preparations.  She does have arthritis but it does not cause much trouble and she usually takes Tylenol  to help with that.  Review of the record indicates we need to follow-up with another DEXA scan concerning her osteopenia.  She does have a history of aortic atherosclerosis.  Otherwise she has no particular concerns or complaints.  Most recent fall risk assessment:    01/24/2024    1:38 PM  Fall Risk   Falls in the past year? 0  Number falls in past yr: 0  Injury with Fall? 0  Risk for fall due to : No Fall Risks  Follow up Falls evaluation completed     Most recent depression screenings:    01/24/2024    1:38 PM 12/05/2023   11:15 AM  PHQ 2/9 Scores  PHQ - 2 Score 0 0  PHQ- 9 Score  0    Vision:Within last year and Dental: No current dental problems and Receives regular dental care    Immunization History  Administered Date(s) Administered   DTaP 04/27/2004   Influenza Whole 06/03/2011   PFIZER(Purple Top)SARS-COV-2 Vaccination 09/19/2019, 10/16/2019, 06/01/2020   PNEUMOCOCCAL CONJUGATE-20 01/24/2024   Pfizer Covid-19 Vaccine Bivalent Booster 48yrs & up 05/18/2021   Pneumococcal Conjugate-13 05/23/2007   Pneumococcal Polysaccharide-23 02/11/2013   Tdap 07/19/2007, 10/03/2017   Zoster Recombinant(Shingrix) 11/16/2021, 02/21/2022   Zoster, Live 07/15/2008    Health Maintenance  Topic Date Due    COVID-19 Vaccine (5 - 2024-25 season) 03/19/2023   INFLUENZA VACCINE  02/16/2024   Medicare Annual Wellness (AWV)  12/04/2024   DTaP/Tdap/Td (4 - Td or Tdap) 10/04/2027   Pneumococcal Vaccine: 50+ Years  Completed   DEXA SCAN  Completed   Zoster Vaccines- Shingrix  Completed   Hepatitis B Vaccines  Aged Out   HPV VACCINES  Aged Out   Meningococcal B Vaccine  Aged Out    Patient Care Team: Joyce Norleen BROCKS, MD as PCP - General (Family Medicine) Mona Vinie BROCKS, MD as PCP - Cardiology (Cardiology)   Outpatient Medications Prior to Visit  Medication Sig   acetaminophen  (TYLENOL ) 500 MG tablet Take 500 mg by mouth 2 (two) times daily.   aspirin  81 MG tablet Take 81 mg by mouth every morning.   benzonatate  (TESSALON ) 200 MG capsule Take 1 capsule (200 mg total) by mouth 3 (three) times daily as needed for cough.   Calcium Carb-Cholecalciferol (CALCIUM 600-D PO) Take 1 tablet by mouth every morning.   Cholecalciferol (VITAMIN D3 PO) Take 1 tablet by mouth every morning.   metoprolol  tartrate (LOPRESSOR ) 50 MG tablet TAKE 1 TABLET BY MOUTH TWICE A DAY   Multiple Vitamins-Minerals (ZINC PO) Take 1 tablet by mouth every morning.   pravastatin  (PRAVACHOL ) 40 MG tablet TAKE 1 TABLET BY MOUTH EVERY DAY IN THE MORNING   vitamin B-12 (CYANOCOBALAMIN ) 1000 MCG  tablet Take 1,000 mcg by mouth daily.   [DISCONTINUED] losartan -hydrochlorothiazide  (HYZAAR) 50-12.5 MG tablet TAKE 1/2 TABLET BY MOUTH EVERY MORNING   azithromycin  (ZITHROMAX ) 250 MG tablet 2 tablets day 1, then 1 tablet days 2-4 (Patient not taking: Reported on 01/24/2024)   No facility-administered medications prior to visit.    Review of Systems  All other systems reviewed and are negative.   Family and social history as well as health maintenance and immunizations was reviewed.     Objective:       Physical Exam  Alert and in no distress. Tympanic membranes and canals are normal. Pharyngeal area is normal. Neck is supple  without adenopathy or thyromegaly. Cardiac exam shows a regular sinus rhythm without murmurs or gallops. Lungs are clear to auscultation.      Assessment & Plan:     Routine general medical examination at a health care facility  Osteopenia, unspecified location - Plan: HM DEXA SCAN, VITAMIN D  25 Hydroxy (Vit-D Deficiency, Fractures)  Primary osteoarthritis involving multiple joints  Obesity (BMI 30.0-34.9)  Mixed hyperlipidemia - Plan: Lipid panel  Benign hypertensive heart disease without heart failure  Benign essential hypertension - Plan: CBC with Differential/Platelet, Comprehensive metabolic panel with GFR, losartan -hydrochlorothiazide  (HYZAAR) 50-12.5 MG tablet  Aortic atherosclerosis (HCC)  Need for vaccination against Streptococcus pneumoniae - Plan: Pneumococcal conjugate vaccine 20-valent (Prevnar 20)  Recommend she take the RSV shot as well as flu and COVID.  Also recommend she just take a good multivitamin instead of taking multiple separate multivitamins. Return in about 1 year (around 01/23/2025).      Norleen Jobs, MD

## 2024-01-25 ENCOUNTER — Ambulatory Visit: Payer: Self-pay | Admitting: Family Medicine

## 2024-01-25 LAB — LIPID PANEL
Chol/HDL Ratio: 4.8 ratio — ABNORMAL HIGH (ref 0.0–4.4)
Cholesterol, Total: 177 mg/dL (ref 100–199)
HDL: 37 mg/dL — ABNORMAL LOW
LDL Chol Calc (NIH): 108 mg/dL — ABNORMAL HIGH (ref 0–99)
Triglycerides: 185 mg/dL — ABNORMAL HIGH (ref 0–149)
VLDL Cholesterol Cal: 32 mg/dL (ref 5–40)

## 2024-01-25 LAB — COMPREHENSIVE METABOLIC PANEL WITH GFR
ALT: 14 IU/L (ref 0–32)
AST: 18 IU/L (ref 0–40)
Albumin: 4.3 g/dL (ref 3.7–4.7)
Alkaline Phosphatase: 89 IU/L (ref 44–121)
BUN/Creatinine Ratio: 16 (ref 12–28)
BUN: 13 mg/dL (ref 8–27)
Bilirubin Total: 0.5 mg/dL (ref 0.0–1.2)
CO2: 24 mmol/L (ref 20–29)
Calcium: 9.7 mg/dL (ref 8.7–10.3)
Chloride: 104 mmol/L (ref 96–106)
Creatinine, Ser: 0.79 mg/dL (ref 0.57–1.00)
Globulin, Total: 2.4 g/dL (ref 1.5–4.5)
Glucose: 89 mg/dL (ref 70–99)
Potassium: 4.1 mmol/L (ref 3.5–5.2)
Sodium: 141 mmol/L (ref 134–144)
Total Protein: 6.7 g/dL (ref 6.0–8.5)
eGFR: 73 mL/min/1.73 (ref >59)

## 2024-01-25 LAB — CBC WITH DIFFERENTIAL/PLATELET
Basophils Absolute: 0 x10E3/uL (ref 0.0–0.2)
Basos: 1 %
EOS (ABSOLUTE): 0.3 x10E3/uL (ref 0.0–0.4)
Eos: 4 %
Hematocrit: 42.5 % (ref 34.0–46.6)
Hemoglobin: 13.6 g/dL (ref 11.1–15.9)
Immature Grans (Abs): 0 x10E3/uL (ref 0.0–0.1)
Immature Granulocytes: 0 %
Lymphocytes Absolute: 1.7 x10E3/uL (ref 0.7–3.1)
Lymphs: 20 %
MCH: 27.9 pg (ref 26.6–33.0)
MCHC: 32 g/dL (ref 31.5–35.7)
MCV: 87 fL (ref 79–97)
Monocytes Absolute: 0.5 x10E3/uL (ref 0.1–0.9)
Monocytes: 7 %
Neutrophils Absolute: 5.7 x10E3/uL (ref 1.4–7.0)
Neutrophils: 68 %
Platelets: 179 x10E3/uL (ref 150–450)
RBC: 4.88 x10E6/uL (ref 3.77–5.28)
RDW: 14 % (ref 11.7–15.4)
WBC: 8.3 x10E3/uL (ref 3.4–10.8)

## 2024-01-25 LAB — VITAMIN D 25 HYDROXY (VIT D DEFICIENCY, FRACTURES): Vit D, 25-Hydroxy: 61.3 ng/mL (ref 30.0–100.0)

## 2024-02-09 ENCOUNTER — Other Ambulatory Visit: Payer: Self-pay | Admitting: Family Medicine

## 2024-02-09 DIAGNOSIS — I10 Essential (primary) hypertension: Secondary | ICD-10-CM

## 2024-03-21 ENCOUNTER — Ambulatory Visit: Admitting: Family Medicine

## 2024-03-21 ENCOUNTER — Encounter: Payer: Self-pay | Admitting: Family Medicine

## 2024-03-21 ENCOUNTER — Ambulatory Visit: Payer: Self-pay

## 2024-03-21 VITALS — BP 124/80 | HR 52 | Wt 151.6 lb

## 2024-03-21 DIAGNOSIS — S61011A Laceration without foreign body of right thumb without damage to nail, initial encounter: Secondary | ICD-10-CM

## 2024-03-21 NOTE — Telephone Encounter (Signed)
 FYI Only or Action Required?: FYI only for provider.  Patient was last seen in primary care on 01/24/2024 by Joyce Norleen BROCKS, MD.  Called Nurse Triage reporting Extremity Laceration. Bleeding not stopping - pt not keeping pressure on cut.  Symptoms began yesterday.  Interventions attempted: Other: Pressure for short amount of time.  Symptoms are: stable.  Triage Disposition: See Physician Within 24 Hours  Patient/caregiver understands and will follow disposition?: Yes                Copied from CRM #[id number]. Topic: Clinical - Red Word Triage >> Mar 21, 2024  7:35 AM [place] MATSU wrote: Red Word that prompted transfer to Nurse Triage: cut on hand right and the bleeding does not stop its been like this all night Reason for Disposition  No prior tetanus shots (or is not fully vaccinated)  Answer Assessment - Initial Assessment Questions 1. APPEARANCE of INJURY: What does the injury look like?      cut 2. ONSET: How long ago did the injury occur?      Last night 3. LOCATION: Where is the injury located?      Bend of her thumb 4. SIZE: How large is the cut?      1 inch 5. BLEEDING: Is it bleeding now? If Yes, ask: Is it difficult to stop?      yes 6. PAIN: Is there any pain? If Yes, ask: How bad is the pain? (Scale 0-10; or none, mild, moderate, severe)     yes 7. MECHANISM: Tell me how it happened.      knife 8. TETANUS: When was your last tetanus booster?     Did not ask  Protocols used: Cuts and Lacerations-A-AH

## 2024-03-21 NOTE — Progress Notes (Signed)
   Name: Anna Acevedo   Date of Visit: 03/21/24   Date of last visit with me: Visit date not found   CHIEF COMPLAINT:  Chief Complaint  Patient presents with   Acute Visit    Laceration. Was cutting pie and the knife slide, bled all night.        HPI:  Discussed the use of AI scribe software for clinical note transcription with the patient, who gave verbal consent to proceed.  History of Present Illness   Anna Acevedo is an 89 year old female who presents with a laceration on her thumb.  She sustained a laceration on her thumb last night while cutting a pie crust with a knife. The injury resulted in continuous bleeding throughout the night, necessitating three dressing changes due to persistent bleeding.  She attributes the difficulty in controlling the bleeding to her regular aspirin  use, which she takes daily. Her daughter suggested that the aspirin  might be contributing to the bleeding. She took her usual dose of aspirin  this morning.  She applied pressure to the wound, but the bleeding did not cease. 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 She wants to be able to fulfill this role.  No pain in the thumb, stating she can tolerate that better than seeing the blood.         OBJECTIVE:       01/24/2024    1:38 PM  Depression screen PHQ 2/9  Decreased Interest 0  Down, Depressed, Hopeless 0  PHQ - 2 Score 0     BP Readings from Last 3 Encounters:  03/21/24 124/80  01/24/24 124/80  12/05/23 128/70    BP 124/80   Pulse (!) 52   Wt 151 lb 9.6 oz (68.8 kg)   SpO2 96%   BMI 26.85 kg/m    Physical Exam   EXTREMITIES: Laceration on thumb with controlled bleeding, no stitches required.      Physical Exam  Inspection reveals 1/4 inch vertical laceration over the volar side of the thumb at the IP.  There is some noted bleeding from the area which does decrease with pressure.No  redness or signs of infection.  ASSESSMENT/PLAN:   Assessment & Plan Laceration of skin of right thumb, initial encounter    Assessment and Plan    Laceration of right thumb Laceration with significant bleeding, exacerbated by aspirin  use. No sutures needed. Delayed clotting due to aspirin , slower healing expected due to age. - Discontinue aspirin  for now. - Clean wound, apply Mastisol to seal. - Apply Vaseline, cover with gauze and tape. - Avoid hydrogen peroxide. - Provide gauze for daily dressing changes. - Follow-up next Wednesday to assess healing.         Anna Acevedo A. Vita MD [facility]

## 2024-03-21 NOTE — Telephone Encounter (Signed)
 Has appt today

## 2024-03-27 ENCOUNTER — Ambulatory Visit: Admitting: Family Medicine

## 2024-03-27 ENCOUNTER — Encounter: Payer: Self-pay | Admitting: Family Medicine

## 2024-03-27 VITALS — BP 120/74 | HR 54 | Wt 151.8 lb

## 2024-03-27 DIAGNOSIS — S61011D Laceration without foreign body of right thumb without damage to nail, subsequent encounter: Secondary | ICD-10-CM

## 2024-03-27 DIAGNOSIS — S61011S Laceration without foreign body of right thumb without damage to nail, sequela: Secondary | ICD-10-CM

## 2024-03-27 MED ORDER — BENZONATATE 200 MG PO CAPS: 200.0000 mg | ORAL_CAPSULE | Freq: Three times a day (TID) | ORAL | 0 refills | Status: DC | PRN

## 2024-03-27 NOTE — Progress Notes (Signed)
   Name: Anna Acevedo   Date of Visit: 03/27/24   Date of last visit with me: 03/21/2024   CHIEF COMPLAINT:  Chief Complaint  Patient presents with   Follow-up    Follow up.        HPI:  Discussed the use of AI scribe software for clinical note transcription with the patient, who gave verbal consent to proceed.  History of Present Illness   Anna Acevedo is an 89 year old female who presents for follow-up of a hand laceration.  Initially, it was treated with medical adhesive glue, which remained in place for about two to three days. She has resumed aspirin , which she had paused due to the injury, and uses a glove to protect the wound while washing dishes to prevent it from getting wet.  She has not experienced any problems with the wound and has been able to play the piano without difficulty. She is relieved that stitches were not required, as she was concerned about potential complications such as infection.  She uses Tessalon  Perles effectively for managing symptoms of a cold or 'the sniffles' and prefers to have them on hand for when she feels symptoms coming on.  Socially, she is active and maintains a close relationship with three young girls whom she considers as her adopted granddaughters. She enjoys playing the piano and has been able to continue this activity despite her recent hand injury.         OBJECTIVE:       01/24/2024    1:38 PM  Depression screen PHQ 2/9  Decreased Interest 0  Down, Depressed, Hopeless 0  PHQ - 2 Score 0     BP Readings from Last 3 Encounters:  03/27/24 120/74  03/21/24 124/80  01/24/24 124/80    BP 120/74   Pulse (!) 54   Wt 151 lb 12.8 oz (68.9 kg)   SpO2 98%   BMI 26.89 kg/m    Physical Exam   SKIN: Skin on hand healing well, no bleeding.      Physical Exam  Inspection of the laceration of the right thumb appears to have no bleeding and healing appropriate.  Wound closure noted with approximation of edges. No  signs of infection. ASSESSMENT/PLAN:   Assessment & Plan Laceration of skin of right thumb, sequela    Assessment and Plan    Healing finger laceration Laceration healing well with medical glue, no infection. - Resume aspirin . - Leave wound open to air, discontinue Band-Aid unless bleeding. - Use Band-Aid if wound reopens and bleeds. - Counseled on careful knife use.  Cough, as needed management Tessalon  Perles effective for symptom management. - Resend prescription for Tessalon  Perles as needed.         Truxton Stupka A. Vita MD [facility]

## 2024-03-27 NOTE — Addendum Note (Signed)
 Addended by: Alika Eppes on: 03/27/2024 12:06 PM   Modules accepted: Orders

## 2024-05-17 ENCOUNTER — Ambulatory Visit: Payer: Self-pay

## 2024-05-17 ENCOUNTER — Ambulatory Visit: Admitting: Medical

## 2024-05-17 ENCOUNTER — Ambulatory Visit
Admission: RE | Admit: 2024-05-17 | Discharge: 2024-05-17 | Disposition: A | Discharge status: Home / Self Care | Source: Ambulatory Visit | Attending: Medical | Admitting: Medical

## 2024-05-17 ENCOUNTER — Ambulatory Visit: Payer: Self-pay | Admitting: Medical

## 2024-05-17 VITALS — BP 124/66 | HR 66 | Temp 98.0°F | Wt 152.6 lb

## 2024-05-17 DIAGNOSIS — M25551 Pain in right hip: Secondary | ICD-10-CM

## 2024-05-17 DIAGNOSIS — W19XXXA Unspecified fall, initial encounter: Secondary | ICD-10-CM | POA: Diagnosis not present

## 2024-05-17 DIAGNOSIS — S6991XA Unspecified injury of right wrist, hand and finger(s), initial encounter: Secondary | ICD-10-CM | POA: Diagnosis not present

## 2024-05-17 DIAGNOSIS — M79641 Pain in right hand: Secondary | ICD-10-CM

## 2024-05-17 DIAGNOSIS — M545 Low back pain, unspecified: Secondary | ICD-10-CM

## 2024-05-17 DIAGNOSIS — M25531 Pain in right wrist: Secondary | ICD-10-CM

## 2024-05-17 NOTE — Telephone Encounter (Signed)
She has an appt this afternoon 

## 2024-05-17 NOTE — Telephone Encounter (Signed)
 FYI Only or Action Required?: Action required by provider: request for appointment.  Patient was last seen in primary care on 03/27/2024 by Vita Morrow, MD.  Called Nurse Triage reporting No chief complaint on file..  Symptoms began yesterday.  Interventions attempted: Nothing.  Symptoms are: gradually worsening.  Triage Disposition: No disposition on file.  Patient/caregiver understands and will follow disposition?:    Copied from CRM #[id number]. Topic: Clinical - Red Word Triage >> May 17, 2024 10:57 AM Anna Acevedo wrote: Red Word that prompted transfer to Nurse Triage: Patient stumbled and hit the wall. She hurt her hand. She states she's not getting around too well, barely slept last night because of the pain. Reason for Disposition  [1] MODERATE weakness (e.g., interferes with work, school, normal activities) AND [2] new-onset or getting worse  Answer Assessment - Initial Assessment Questions 1. MECHANISM: How did the fall happen?     Doing some work in the garden  2. DOMESTIC VIOLENCE AND ELDER ABUSE SCREENING: Did you fall because someone pushed you or tried to hurt you? If Yes, ask: Are you safe now?     No  3. ONSET: When did the fall happen? (e.g., minutes, hours, or days ago)     Last Night  4. LOCATION: What part of the body hit the ground? (e.g., back, buttocks, head, hips, knees, hands, head, stomach)      Right Hand  5. INJURY: Did you hurt (injure) yourself when you fell? If Yes, ask: What did you injure? Tell me more about this? (e.g., body area; type of injury; pain severity)     Bruising  6. PAIN: Is there any pain? If Yes, ask: How bad is the pain? (e.g., Scale 0-10; or none, mild,      Yes, Moderate  7. SIZE: For cuts, bruises, or swelling, ask: How large is it? (e.g., inches or centimeters)      Bruises, Large Veins  8. PREGNANCY: Is there any chance you are pregnant? When was your last menstrual period?     No and No  9. OTHER  SYMPTOMS: Do you have any other symptoms? (e.g., dizziness, fever, weakness; new-onset or worsening).      No  10. CAUSE: What do you think caused the fall (or falling)? (e.g., dizzy spell, tripped)       Unsure  Protocols used: Falls and [facility]

## 2024-05-17 NOTE — Patient Instructions (Addendum)
 Please go to [facility] for your hand, wrist and hip xrays.   Their hours are 8am - 4:30 pm Monday - Friday.  Take your insurance card with you.  [facility] [phone]   [address] [place], [place] [address]

## 2024-05-17 NOTE — Progress Notes (Signed)
 I called and spoke to Anna Acevedo her son.  Thankfully no fracture of hip wrist or hand..  She will use wrist splint over the weekend, rest, Tylenol  as needed.  I asked her to follow-up with one of us  next week in about 5 days for recheck

## 2024-05-17 NOTE — Progress Notes (Signed)
 Subjective:  Anna Acevedo is a 89 y.o. female who presents for Chief Complaint  Patient presents with   Acute Visit    [place] at church and landed on her right arm/wrist and then right thigh/hip and son states she is holding her side when she walks. Unable to bend wrist must without pain. Lower back is starting to hurt     Anna Acevedo is an 89 year old female who presents with right hand, arm, and hip pain following a fall. She is accompanied by her son, Manus Adolphus.  She fell last night at church while carrying items in her arms. She was ascending a flight of stairs and became unsteady at the top, losing her balance and falling on the landing. She did not lose consciousness during the fall.  No head injury.   She landed on her right side, impacting her right hand, arm, and hip. She experiences pain in these areas, with the right hand and arm being the most painful. The pain in her arm worsens with movement, particularly when lifting or rotating the wrist. No head impact, confusion, or slurred speech occurred following the incident.  For pain management, she took Tylenol  last night. After sitting for about 15 minutes, she was able to pull herself up using a door handle after the fall and walked down the hall.  No unusual heartbeats, no palpitations, no symptoms suggestive of a blackout at the time of the fall.  No other aggravating or relieving factors.    No other c/o.  Past Medical History:  Diagnosis Date   Colon polyp    Diverticulosis    Dyspnea    GERD (gastroesophageal reflux disease)    Hypercholesterolemia    Hypertension    Essential   Obesity    Osteoporosis    OSTEOPENIA   Palpitations    Vitamin D  deficiency    Current Outpatient Medications on File Prior to Visit  Medication Sig Dispense Refill   acetaminophen  (TYLENOL ) 500 MG tablet Take 500 mg by mouth 2 (two) times daily.     aspirin  81 MG tablet Take 81 mg by mouth every morning.     Calcium  Carb-Cholecalciferol (CALCIUM 600-D PO) Take 1 tablet by mouth every morning.     Cholecalciferol (VITAMIN D3 PO) Take 1 tablet by mouth every morning.     losartan -hydrochlorothiazide  (HYZAAR) 50-12.5 MG tablet TAKE 1/2 TABLET BY MOUTH EVERY MORNING 90 tablet 0   metoprolol  tartrate (LOPRESSOR ) 50 MG tablet TAKE 1 TABLET BY MOUTH TWICE A DAY 180 tablet 3   Multiple Vitamins-Minerals (ZINC PO) Take 1 tablet by mouth every morning.     pravastatin  (PRAVACHOL ) 40 MG tablet TAKE 1 TABLET BY MOUTH EVERY DAY IN THE MORNING 90 tablet 3   No current facility-administered medications on file prior to visit.   Past Surgical History:  Procedure Laterality Date   CARDIOVASCULAR STRESS TEST  03/30/2006   EF 72%. Showed no evidence of ischemia    COLONOSCOPY     gets every 5 years   EYE SURGERY Bilateral 07/18/2012   Cataracts   SKIN BIOPSY  04/21/2022   left inferior medial posterior arm verruca vulgaris inflamed   US  ECHOCARDIOGRAPHY  03/18/2006   EF 55-60%. Showing  impaired diastolic relaxation and mild mitral regurgitation    The following portions of the patient's history were reviewed and updated as appropriate: allergies, current medications, past family history, past medical history, past social history, past surgical history and problem list.  ROS Otherwise as in subjective above  Objective: BP 124/66   Pulse 66   Temp 98 F (36.7 C)   Wt 152 lb 9.6 oz (69.2 kg)   SpO2 98%   BMI 27.03 kg/m   General appearance: alert, no distress, well developed, well nourished Psych: Pleasant, answers questions appropriately, no obvious confusion Neck: Supple, nontender, normal range of motion Mild tenderness right upper back paraspinal region, mild tenderness right lower back paraspinal region but no tenderness over the spine and no tenderness to percussion, range of motion relatively normal MSK: Mild tenderness over the right lateral hip, no pelvic tenderness, leg range of motion within  normal limits Tender over right wrist in general tender over the distal radius, tender over the right hand at the MCPs in general.  Fingers otherwise nontender, wrist flexion on the right is reduced compared to left.  Mild tenderness along the right forearm but most notable tenderness is at the wrist of the right hand.  Rest of arms otherwise unremarkable No obvious bruising of her arms or legs or back or neck Upper and lower extremity pulses normal Strength and sensation of upper and lower extremities normal    Assessment: Encounter Diagnoses  Name Primary?   Injury of right wrist, initial encounter Yes   Fall, initial encounter    Acute right-sided low back pain without sciatica    Right hip pain      Plan: Right wrist and hand injury, possible fracture Acute injury with suspected fracture due to pain and guarded movement. No neurovascular compromise. Differential includes fracture or severe contusion. - Advise use of reinforced wrist splint over the weekend. - Recommend cold pack application for pain and inflammation, 15-44min TID. - Suggest acetaminophen  for pain management. -go for STAT xrays now  Right hip and buttock contusion and pain Contusion and pain post-fall. No significant tenderness or range of motion limitation. Bruising and soreness expected to increase. - Advise acetaminophen  as needed for pain. -go for hip xray now   Evagelia was seen today for acute visit.  Diagnoses and all orders for this visit:  Injury of right wrist, initial encounter  Fall, initial encounter  Acute right-sided low back pain without sciatica  Right hip pain    Follow up: pending xray

## 2024-06-04 ENCOUNTER — Other Ambulatory Visit: Payer: Self-pay | Admitting: Family Medicine

## 2024-06-04 DIAGNOSIS — I10 Essential (primary) hypertension: Secondary | ICD-10-CM

## 2024-07-29 ENCOUNTER — Encounter: Payer: Self-pay | Admitting: Family Medicine

## 2024-07-29 ENCOUNTER — Ambulatory Visit: Payer: Self-pay | Admitting: Family Medicine

## 2024-07-29 VITALS — BP 110/64 | HR 59 | Ht 63.0 in | Wt 148.8 lb

## 2024-07-29 DIAGNOSIS — S61011S Laceration without foreign body of right thumb without damage to nail, sequela: Secondary | ICD-10-CM | POA: Diagnosis not present

## 2024-07-29 DIAGNOSIS — S6991XA Unspecified injury of right wrist, hand and finger(s), initial encounter: Secondary | ICD-10-CM | POA: Diagnosis not present

## 2024-07-29 NOTE — Progress Notes (Signed)
" ° °  Name: Anna Acevedo   Date of Visit: 07/29/2024   Date of last visit with me: 03/27/2024   CHIEF COMPLAINT:  Chief Complaint  Patient presents with   other    4 month F/U rt thumb laceration-       HPI:  Discussed the use of AI scribe software for clinical note transcription with the patient, who gave verbal consent to proceed.  History of Present Illness   Anna Acevedo is an 89 year old female who presents for follow-up after a fall and hand injury.  She experienced a fall after her last visit, resulting in a hand injury. She wore a brace for a while following the fall and was concerned about her ability to play the piano, which was her main concern after the fall. Currently, she reports no issues with her hand and states that it feels 'just like it's supposed to.'  She has been taking aspirin , although there was a period after the fall and hand laceration when she stopped it for about two weeks. She resumed taking it after that period.  Her children have arranged for a life alert system for her, which she finds acceptable, although she mentions some concern about charging it.         OBJECTIVE:       01/24/2024    1:38 PM  Depression screen PHQ 2/9  Decreased Interest 0  Down, Depressed, Hopeless 0  PHQ - 2 Score 0     BP Readings from Last 3 Encounters:  07/29/24 110/64  05/17/24 124/66  03/27/24 120/74    BP 110/64   Pulse (!) 59   Ht 5' 3 (1.6 m)   Wt 148 lb 12.8 oz (67.5 kg)   SpO2 96%   BMI 26.36 kg/m    Physical Exam    Appropriately healed laceration of the right thumb.  No concerns.     Physical Exam  ASSESSMENT/PLAN:   Assessment & Plan Injury of right wrist, initial encounter  Laceration of skin of right thumb, sequela    Assessment and Plan    Laceration of right thumb, sequela Laceration healing well, no sensation loss or complications. - Continue using the brace as needed for comfort.  Injury of right wrist Right  wrist injury healing well, no pain or functional limitations. - Continue using the brace as needed for comfort.  General Health Maintenance Good health, well-controlled blood pressure, appropriate weight. Aspirin  resumed post-fall. Life alert system in place. - Continue aspirin  therapy. - Scheduled physical and Medicare wellness exam after July 9th.         Shawan Tosh A. Vita MD [facility] "

## 2024-08-20 NOTE — Progress Notes (Unsigned)
 Start time: End time:  Virtual Visit via Video Note  I connected with Anna Acevedo on 08/20/24 by a video enabled telemedicine application and verified that I am speaking with the correct person using two identifiers.  Location: Patient: *** Provider: office   I discussed the limitations of evaluation and management by telemedicine and the availability of in person appointments. The patient expressed understanding and agreed to proceed.  History of Present Illness:  No chief complaint on file.     Observations/Objective:  There were no vitals taken for this visit.   Assessment and Plan:   Follow Up Instructions:    I discussed the assessment and treatment plan with the patient. The patient was provided an opportunity to ask questions and all were answered. The patient agreed with the plan and demonstrated an understanding of the instructions.   The patient was advised to call back or seek an in-person evaluation if the symptoms worsen or if the condition fails to improve as anticipated.  I spent *** minutes dedicated to the care of this patient, including pre-visit review of records, face to face time, post-visit ordering of testing and documentation.    [name], MD

## 2024-08-21 ENCOUNTER — Encounter: Payer: Self-pay | Admitting: *Deleted

## 2024-08-21 ENCOUNTER — Ambulatory Visit: Admitting: Family Medicine

## 2024-08-21 ENCOUNTER — Encounter: Payer: Self-pay | Admitting: Family Medicine

## 2024-08-21 VITALS — BP 128/70 | HR 60 | Temp 98.0°F | Ht 63.0 in | Wt 149.2 lb

## 2024-08-21 DIAGNOSIS — I10 Essential (primary) hypertension: Secondary | ICD-10-CM

## 2024-08-21 DIAGNOSIS — R051 Acute cough: Secondary | ICD-10-CM

## 2024-08-21 NOTE — Patient Instructions (Signed)
 Stay well hydrated. Use some saline spray into the nose to help with congestion, especially if you notice any bleeding (or brown phlegm). Since it is possible that the runny nose and cough was triggered by allergies, I recommend taking claritin 10 mg (store version loratidine is perfectly fine) once daily (or Allegra 180mg ). This should help dry up some of the runny nose. I also recommend taking Mucinex 12 hour (either the plain or the DM version). The guaifenesin in the mucinex is an expectorant, which helps keep the mucus and phlegm thin. The dextromethorphan in the DM version is a cough suppressant. You may not need to use the tessalon  while taking the Mucinex DM, but you CAN, if you are still coughing.  Avoid decongestants (anything that says sinus or cold) as these raise blood pressure.  If you notice any fever, sinus pain, discolored mucus or phlegm, or worsening cough, especially if you have any pain with breathing or any shortness of breath, please return for re-evaluation, as you may have an infection.

## 2024-12-17 ENCOUNTER — Ambulatory Visit: Payer: Self-pay

## 2025-01-28 ENCOUNTER — Ambulatory Visit: Payer: Self-pay | Admitting: Family Medicine
